# Patient Record
Sex: Female | Born: 1940 | Race: White | Hispanic: No
Health system: Southern US, Community
[De-identification: ages and names within clinical notes are randomized; demographics above are authoritative.]

## PROBLEM LIST (undated history)

## (undated) DIAGNOSIS — Z923 Personal history of irradiation: Secondary | ICD-10-CM

## (undated) DIAGNOSIS — C4490 Unspecified malignant neoplasm of skin, unspecified: Secondary | ICD-10-CM

## (undated) DIAGNOSIS — L570 Actinic keratosis: Secondary | ICD-10-CM

## (undated) DIAGNOSIS — L309 Dermatitis, unspecified: Secondary | ICD-10-CM

## (undated) DIAGNOSIS — K219 Gastro-esophageal reflux disease without esophagitis: Secondary | ICD-10-CM

## (undated) DIAGNOSIS — M545 Low back pain, unspecified: Secondary | ICD-10-CM

## (undated) DIAGNOSIS — Z9889 Other specified postprocedural states: Secondary | ICD-10-CM

## (undated) DIAGNOSIS — M1990 Unspecified osteoarthritis, unspecified site: Secondary | ICD-10-CM

## (undated) DIAGNOSIS — M75100 Unspecified rotator cuff tear or rupture of unspecified shoulder, not specified as traumatic: Secondary | ICD-10-CM

## (undated) HISTORY — PX: BREAST LUMPECTOMY: SHX2

## (undated) HISTORY — DX: Actinic keratosis: L57.0

## (undated) HISTORY — DX: Unspecified malignant neoplasm of skin, unspecified: C44.90

## (undated) HISTORY — PX: JOINT REPLACEMENT: SHX530

## (undated) HISTORY — PX: COLONOSCOPY, DIAGNOSTIC (SCREENING): SHX174

## (undated) HISTORY — PX: EGD: SHX3789

---

## 2002-07-21 HISTORY — PX: KNEE ARTHROSCOPY: SUR90

## 2012-07-21 DIAGNOSIS — C50919 Malignant neoplasm of unspecified site of unspecified female breast: Secondary | ICD-10-CM

## 2012-07-21 HISTORY — DX: Malignant neoplasm of unspecified site of unspecified female breast: C50.919

## 2013-04-04 ENCOUNTER — Other Ambulatory Visit: Payer: Self-pay | Admitting: Surgery

## 2013-04-04 NOTE — Pre-Procedure Instructions (Signed)
Low risk surgery, no medications,benign hx, no testing needed. H/P to scan

## 2013-04-05 ENCOUNTER — Other Ambulatory Visit: Payer: Self-pay

## 2013-04-06 ENCOUNTER — Encounter: Payer: Self-pay | Admitting: Physician Assistant

## 2013-04-06 ENCOUNTER — Ambulatory Visit
Admission: RE | Admit: 2013-04-06 | Discharge: 2013-04-06 | Disposition: A | Discharge status: Home / Self Care | Payer: Medicare Other | Source: Ambulatory Visit | Attending: Surgery | Admitting: Surgery

## 2013-04-06 ENCOUNTER — Other Ambulatory Visit: Payer: Self-pay

## 2013-04-06 ENCOUNTER — Ambulatory Visit: Payer: Medicare Other

## 2013-04-06 ENCOUNTER — Encounter
Admission: RE | Disposition: A | Discharge status: Home / Self Care | Payer: Self-pay | Source: Ambulatory Visit | Attending: Surgery

## 2013-04-06 ENCOUNTER — Ambulatory Visit: Payer: Medicare Other | Admitting: Physician Assistant

## 2013-04-06 ENCOUNTER — Ambulatory Visit: Payer: Self-pay

## 2013-04-06 ENCOUNTER — Ambulatory Visit: Payer: Medicare Other | Admitting: Surgery

## 2013-04-06 DIAGNOSIS — Z170 Estrogen receptor positive status [ER+]: Secondary | ICD-10-CM | POA: Insufficient documentation

## 2013-04-06 DIAGNOSIS — C50419 Malignant neoplasm of upper-outer quadrant of unspecified female breast: Secondary | ICD-10-CM | POA: Insufficient documentation

## 2013-04-06 DIAGNOSIS — Z803 Family history of malignant neoplasm of breast: Secondary | ICD-10-CM | POA: Insufficient documentation

## 2013-04-06 DIAGNOSIS — K219 Gastro-esophageal reflux disease without esophagitis: Secondary | ICD-10-CM | POA: Insufficient documentation

## 2013-04-06 HISTORY — PX: BIOPSY, SENTINEL NODE: SHX3265

## 2013-04-06 HISTORY — DX: Low back pain, unspecified: M54.50

## 2013-04-06 HISTORY — DX: Gastro-esophageal reflux disease without esophagitis: K21.9

## 2013-04-06 HISTORY — PX: BIOPSY, BREAST, TUMOR EXCISION, ULTRASOUND NEEDLE LOCALIZATION: SHX3236

## 2013-04-06 HISTORY — DX: Unspecified osteoarthritis, unspecified site: M19.90

## 2013-04-06 HISTORY — DX: Other specified postprocedural states: Z98.890

## 2013-04-06 HISTORY — DX: Dermatitis, unspecified: L30.9

## 2013-04-06 HISTORY — DX: Unspecified rotator cuff tear or rupture of unspecified shoulder, not specified as traumatic: M75.100

## 2013-04-06 SURGERY — BIOPSY, BREAST, WITH NEEDLE LOCALIZATION, WITH ULTRASOUND (US) GUIDANCE
Anesthesia: Anesthesia General | Site: Breast | Laterality: Right | Wound class: Clean

## 2013-04-06 MED ORDER — BUPIVACAINE-EPINEPHRINE (PF) 0.5% -1:200000 IJ SOLN
INTRAMUSCULAR | Status: AC
Start: 2013-04-06 — End: ?
  Filled 2013-04-06: qty 30

## 2013-04-06 MED ORDER — FENTANYL CITRATE 0.05 MG/ML IJ SOLN
INTRAMUSCULAR | Status: DC | PRN
Start: 2013-04-06 — End: 2013-04-06
  Administered 2013-04-06 (×4): 25 ug via INTRAVENOUS

## 2013-04-06 MED ORDER — ACETAMINOPHEN 325 MG PO TABS
650.0000 mg | ORAL_TABLET | Freq: Once | ORAL | Status: DC | PRN
Start: 2013-04-06 — End: 2013-04-06

## 2013-04-06 MED ORDER — LIDOCAINE HCL 1 % IJ SOLN
5.0000 mL | Freq: Once | INTRAMUSCULAR | Status: AC
Start: 2013-04-06 — End: 2013-04-06
  Administered 2013-04-06: 5 mL via INTRADERMAL

## 2013-04-06 MED ORDER — ONDANSETRON HCL 4 MG/2ML IJ SOLN
INTRAMUSCULAR | Status: AC
Start: 2013-04-06 — End: ?
  Filled 2013-04-06: qty 2

## 2013-04-06 MED ORDER — ONDANSETRON HCL 4 MG/2ML IJ SOLN
4.0000 mg | Freq: Once | INTRAMUSCULAR | Status: DC | PRN
Start: 2013-04-06 — End: 2013-04-06

## 2013-04-06 MED ORDER — BUPIVACAINE-EPINEPHRINE (PF) 0.5% -1:200000 IJ SOLN
INTRAMUSCULAR | Status: DC | PRN
Start: 2013-04-06 — End: 2013-04-06
  Administered 2013-04-06: 25 mL via INTRAMUSCULAR

## 2013-04-06 MED ORDER — LIDOCAINE-EPINEPHRINE 2 %-1:200000 IJ SOLN
INTRAMUSCULAR | Status: AC
Start: 2013-04-06 — End: ?
  Filled 2013-04-06: qty 20

## 2013-04-06 MED ORDER — TECHNETIUM TC 99M SULFUR COLLOID
307.0000 | Freq: Once | Status: AC | PRN
Start: 2013-04-06 — End: 2013-04-06
  Administered 2013-04-06: 307 via INTRADERMAL
  Filled 2013-04-06: qty 20

## 2013-04-06 MED ORDER — ONDANSETRON HCL 4 MG/2ML IJ SOLN
INTRAMUSCULAR | Status: DC | PRN
Start: 2013-04-06 — End: 2013-04-06
  Administered 2013-04-06: 4 mg via INTRAVENOUS

## 2013-04-06 MED ORDER — PROPOFOL 10 MG/ML IV EMUL
INTRAVENOUS | Status: AC
Start: 2013-04-06 — End: ?
  Filled 2013-04-06: qty 20

## 2013-04-06 MED ORDER — FENTANYL CITRATE 0.05 MG/ML IJ SOLN
INTRAMUSCULAR | Status: AC
Start: 2013-04-06 — End: ?
  Filled 2013-04-06: qty 2

## 2013-04-06 MED ORDER — MIDAZOLAM HCL 2 MG/2ML IJ SOLN
INTRAMUSCULAR | Status: DC | PRN
Start: 2013-04-06 — End: 2013-04-06
  Administered 2013-04-06 (×2): 1 mg via INTRAVENOUS

## 2013-04-06 MED ORDER — LACTATED RINGERS IV SOLN
INTRAVENOUS | Status: DC
Start: 2013-04-06 — End: 2013-04-06

## 2013-04-06 MED ORDER — ISOSULFAN BLUE 1 % SC SOLN
SUBCUTANEOUS | Status: AC
Start: 2013-04-06 — End: ?
  Filled 2013-04-06: qty 5

## 2013-04-06 MED ORDER — HYDROCODONE-ACETAMINOPHEN 5-325 MG PO TABS
1.0000 | ORAL_TABLET | ORAL | 0 refills | Status: DC | PRN
Start: 2013-04-06 — End: 2015-01-09
  Filled 2013-04-06: qty 30, 5d supply, fill #0

## 2013-04-06 MED ORDER — SODIUM CHLORIDE 0.9 % IR SOLN
Status: DC | PRN
Start: 2013-04-06 — End: 2013-04-06
  Administered 2013-04-06: 800 mL

## 2013-04-06 MED ORDER — FENTANYL CITRATE 0.05 MG/ML IJ SOLN
50.0000 ug | INTRAMUSCULAR | Status: DC | PRN
Start: 2013-04-06 — End: 2013-04-06

## 2013-04-06 MED ORDER — MIDAZOLAM HCL 2 MG/2ML IJ SOLN
INTRAMUSCULAR | Status: AC
Start: 2013-04-06 — End: ?
  Filled 2013-04-06: qty 2

## 2013-04-06 MED ORDER — PROMETHAZINE HCL 25 MG/ML IJ SOLN
6.2500 mg | Freq: Once | INTRAMUSCULAR | Status: DC | PRN
Start: 2013-04-06 — End: 2013-04-06

## 2013-04-06 MED ORDER — LIDOCAINE HCL 2 % IJ SOLN
INTRAMUSCULAR | Status: DC | PRN
Start: 2013-04-06 — End: 2013-04-06
  Administered 2013-04-06: 50 mg

## 2013-04-06 MED ORDER — LACTATED RINGERS IV SOLN
INTRAVENOUS | Status: DC | PRN
Start: 2013-04-06 — End: 2013-04-06

## 2013-04-06 MED ORDER — SODIUM CHLORIDE 0.9 % IV SOLN
INTRAVENOUS | Status: DC
Start: 2013-04-06 — End: 2013-04-06

## 2013-04-06 MED ORDER — MEPERIDINE HCL 25 MG/ML IJ SOLN
12.5000 mg | INTRAMUSCULAR | Status: DC | PRN
Start: 2013-04-06 — End: 2013-04-06

## 2013-04-06 MED ORDER — METHYLENE BLUE 1 % IJ SOLN
1.0000 mg | Freq: Once | INTRAMUSCULAR | Status: AC
Start: 2013-04-06 — End: 2013-04-06
  Administered 2013-04-06: 1 mg via INTRAVENOUS
  Filled 2013-04-06: qty 1

## 2013-04-06 MED ORDER — ISOSULFAN BLUE 1 % SC SOLN
SUBCUTANEOUS | Status: DC | PRN
Start: 2013-04-06 — End: 2013-04-06
  Administered 2013-04-06: 5 mL via INTRADERMAL

## 2013-04-06 MED ORDER — HYDROMORPHONE HCL PF 1 MG/ML IJ SOLN
0.5000 mg | INTRAMUSCULAR | Status: DC | PRN
Start: 2013-04-06 — End: 2013-04-06

## 2013-04-06 MED ORDER — LIDOCAINE-EPINEPHRINE 2 %-1:100000 IJ SOLN
INTRAMUSCULAR | Status: DC | PRN
Start: 2013-04-06 — End: 2013-04-06
  Administered 2013-04-06: 25 mL

## 2013-04-06 MED ORDER — DEXAMETHASONE SODIUM PHOSPHATE 4 MG/ML IJ SOLN
INTRAMUSCULAR | Status: AC
Start: 2013-04-06 — End: ?
  Filled 2013-04-06: qty 2

## 2013-04-06 MED ORDER — DEXAMETHASONE SODIUM PHOSPHATE 4 MG/ML IJ SOLN (WRAP)
INTRAMUSCULAR | Status: DC | PRN
Start: 2013-04-06 — End: 2013-04-06
  Administered 2013-04-06: 8 mg via INTRAVENOUS

## 2013-04-06 MED ORDER — PROPOFOL 10 MG/ML IV EMUL
INTRAVENOUS | Status: DC | PRN
Start: 2013-04-06 — End: 2013-04-06
  Administered 2013-04-06: 150 mg via INTRAVENOUS

## 2013-04-06 SURGICAL SUPPLY — 48 items
APPL BENZOIN/TINCTURE 1.1ML (Prep) ×3 IMPLANT
BANDAGE ADHESIVE L2 YD X W6 IN STRETCH (Bandage)
BANDAGE ADHESIVE L2 YD X W6 IN STRETCH NONWOVEN POROUS COVER-ROLL (Bandage) IMPLANT
BLADE S/SU RIBBACK CARB STL 15 (Blade) ×3 IMPLANT
BNDG CVRL ADH 2YDX6IN PLSTR POLYACRYLATE (Bandage)
CLIP INTERNAL L2.6 MM SMALL LIGATE OPEN (Clips)
CLIP INTERNAL L2.6 MM SMALL LIGATE OPEN LIGACLIP EXTRA TITANIUM BLUE (Clips) IMPLANT
CLIP INTERNAL L3.2 MM MEDIUM LIGATE OPEN (Clips)
CLIP INTERNAL L3.2 MM MEDIUM LIGATE OPEN LIGACLIPÂ® TITANIUM SILVER (Clips) IMPLANT
CLIP INTNL TI MED LGCLP EX 3.2MM LF STRL (Clips)
CLIP INTNL TI SM LGCLP EX 2.6MM LF STRL (Clips)
CLOTH BEACON TIMEOUT ORANGE (Other) ×3 IMPLANT
CNSR MEDIVAC CRD LINER W LID (Suction) ×3 IMPLANT
COVER PRB 72X5IN STRL ADH TLSCP FOLD (Sheath) ×1
COVER PROBE L72 IN X W5 IN ADHESIVE (Sheath) ×2
COVER PROBE L72 IN X W5 IN ADHESIVE TELESCOPIC FOLD (Sheath) ×2 IMPLANT
COVER PROBE SOFT FLEX L96 IN X W6 IN GEL (Procedure Accessories) ×2
COVER PROBE SOFT FLEX L96 IN X W6 IN GEL ULTRASOUND POLYURETHANE (Procedure Accessories) ×2 IMPLANT
DRAPE PROBE SOFT 6X96 (Procedure Accessories) ×1
DRESSING SCR TGDRM 4.5X3.5IN LF STRL FLM (Dressing) ×2
DRESSING SECUREMENT TEGADERM L4 1/2 IN X (Dressing) ×4
DRESSING SECUREMENT TEGADERM L4 1/2 IN X W3 1/2 IN INTRAVENOUS FILM (Dressing) ×4 IMPLANT
GLOVE SURG BIOGEL ORTHO SZ8 (Glove) ×3 IMPLANT
GLOVE SURG BIOGEL SZ6.5 (Glove) ×3 IMPLANT
GOWN SMART IMPERVIOUS LARGE (Gown) ×6 IMPLANT
KIT INFECTION CONTROL CUSTOM (Kits) ×3
KIT INFECTION CONTROL CUSTOM IFOH03 (Kits) ×2 IMPLANT
NEEDLE 25GA 1 1/2 (Needles) ×6 IMPLANT
NEEDLE REG BEVEL 20GX1.5IN (Needles) ×3 IMPLANT
STRIP SKIN CLOSURE L4 IN X W1/2 IN (Dressing) ×2
STRIP SKIN CLOSURE L4 IN X W1/2 IN REINFORCE STERI-STRIP POLYESTER (Dressing) ×2 IMPLANT
STRIP SKNCLS PLSTR STRSTRP 4X.5IN LF (Dressing) ×1
SUTURE ABS 2-0 SH VCL 27IN BRD COAT VIOL (Suture) ×1
SUTURE ABS 3-0 SH VCL 27IN BRD COAT VIOL (Suture) ×4
SUTURE COATED VICRYL 2-0 SH L27 IN BRAID (Suture) ×2
SUTURE COATED VICRYL 2-0 SH L27 IN BRAID COATED VIOLET ABSORBABLE (Suture) ×2 IMPLANT
SUTURE COATED VICRYL 3-0 SH L27 IN BRAID (Suture) ×8
SUTURE COATED VICRYL 3-0 SH L27 IN BRAID COATED VIOLET ABSORBABLE (Suture) ×8 IMPLANT
SUTURE MONOCRYL 5-0 P3 18IN (Suture) ×6 IMPLANT
SUTURE VICRYL 2-0 SH 27IN (Suture) IMPLANT
SYRINGE LUER LOCK SAFETY 10CC (Syringes, Needles) ×6 IMPLANT
TOWEL L26 IN X W17 IN COTTON PREWASH DELINT BLUE ACTISORB DELUXE (Procedure Accessories) ×2 IMPLANT
TOWEL SRG CTTN 26X17IN LF STRL PREWASH (Procedure Accessories) ×3
TRAY BASIN MINOR (Tray) IMPLANT
TRAY MAJOR (Pack) ×3 IMPLANT
TUBING CONNECTING STERILE 10FT (Tubing)
TUBING SUCTION ID3/16 IN L10 FT (Tubing)
TUBING SUCTION ID3/16 IN L10 FT NONCONDUCTIVE STRAIGHT MALE FEMALE (Tubing) IMPLANT

## 2013-04-06 NOTE — PACU (Signed)
Discharge instructions discussed and given to patient and patient's husband at bedside in pacu, prescriptions given, all questions answered, plan to d/c home.

## 2013-04-06 NOTE — Anesthesia Preprocedure Evaluation (Signed)
Anesthesia Evaluation    AIRWAY    Mallampati: III    TM distance: <3 FB  Neck ROM: full  Mouth Opening:limited   CARDIOVASCULAR    cardiovascular exam normal       DENTAL    No notable dental hx     PULMONARY    pulmonary exam normal     OTHER FINDINGS                      Anesthesia Plan    ASA 2     general                     intravenous induction               informed consent obtained    Plan discussed with CRNA.

## 2013-04-06 NOTE — PACU (Signed)
Re distributed cardiac leads and pt in nsr

## 2013-04-06 NOTE — PACU (Signed)
Post op ice bag applied to rt axilla. encour freq cdbe. Lungs clear bil ant

## 2013-04-06 NOTE — Transfer of Care (Signed)
Anesthesia Transfer of Care Note    Patient: Lauren [name]    Procedures performed: Procedure(s) with comments:  BIOPSY, BREAST, TUMOR EXCISION, ULTRASOUND NEEDLE LOCALIZATION - PARTIAL MASTECTOMY RIGHT BREAST PRE OPERATIVE PLACEMENT OF NEEDLE LOCALIZATION WIRE INTO BREAST & SENTINEL LYMPH NODE BIOPSY AXILLARY- MAPPING BY MD  Q1=UNK, ANES=GEN, ASST=WILL BRING PA, MD REQ=45MINS, EQUIP=N, ALLERGY=MORPHINE,BACTRIM,MOLD, POLLEN RAGWEED, VIOCODIN, BB=N 175, HT 5'3, USD NP AT 8AM, NUC MED INJ IN OR AT 9:15AM.   BIOPSY, SENTINEL NODE    Anesthesia type: General LMA    Patient location:Phase I PACU    Last vitals:   Filed Vitals:    04/06/13 0732   BP: 143/77   Pulse: 96   Temp: 96.8 F (36 C)   Resp: 18   SpO2: 96%       Post pain: Patient not complaining of pain, continue current therapy      Mental Status:awake    Respiratory Function: tolerating face mask    Cardiovascular: stable    Nausea/Vomiting: patient not complaining of nausea or vomiting    Hydration Status: adequate    Post assessment: no apparent anesthetic complications

## 2013-04-06 NOTE — Anesthesia Postprocedure Evaluation (Signed)
Anesthesia Post Evaluation    Patient: Lauren [name]    Procedures performed: Procedure(s) with comments:  BIOPSY, BREAST, TUMOR EXCISION, ULTRASOUND NEEDLE LOCALIZATION - PARTIAL MASTECTOMY RIGHT BREAST PRE OPERATIVE PLACEMENT OF NEEDLE LOCALIZATION WIRE INTO BREAST & SENTINEL LYMPH NODE BIOPSY AXILLARY- MAPPING BY MD  Q1=UNK, ANES=GEN, ASST=WILL BRING PA, MD REQ=45MINS, EQUIP=N, ALLERGY=MORPHINE,BACTRIM,MOLD, POLLEN RAGWEED, VIOCODIN, BB=N 175, HT 5'3, USD NP AT 8AM, NUC MED INJ IN OR AT 9:15AM.   BIOPSY, SENTINEL NODE    Anesthesia type: General LMA    Patient location:Phase I PACU    Last vitals:   Filed Vitals:    04/06/13 1110   BP: 108/63   Pulse: 90   Temp:    Resp: 16   SpO2: 95%       Post pain: Patient not complaining of pain, continue current therapy      Mental Status:awake    Respiratory Function: tolerating room air    Cardiovascular: stable    Nausea/Vomiting: patient not complaining of nausea or vomiting    Hydration Status: adequate    Post assessment: no apparent anesthetic complications

## 2013-04-06 NOTE — Op Note (Signed)
FULL OPERATIVE NOTE    Date Time: 04/06/2013 10:19 AM  Patient Name: Lauren [name]  Attending Physician: Rick Duff,*      Date of Operation:   04/06/2013    Providers Performing:   Surgeon(s):  Rick Duff, MD  Rainier, Josilu, PA    McDouall, Ronnette Hila, RN - Circulator  Defiance, Erin E - Scrub Person  Nicki Reaper, RN - Relief Circulator    Operative Procedure:   Procedure(s):  BIOPSY, BREAST, TUMOR EXCISION, ULTRASOUND NEEDLE LOCALIZATION  BIOPSY, SENTINEL NODE    Preoperative Diagnosis:   Pre-Op Diagnosis Codes:     * Malignant neoplasm of upper-outer quadrant of female breast, right [174.4]    Postoperative Diagnosis:   Malignant neoplasm of upper-outer quadrant of female breast, right [1610960] [    Indications:   same    Operative Notes:   After consent obtained, the patient was brought to the OR.  SCDs were placed and the patient was anesthetized.  Surgical pause was performed. The patient was prepped and draped in sterile fashion.  Nuclear medicine injection was done at the R areola.  Lymphazurin blue was also injected per protocol at the tumor and the areola.    The gamma probe was utilized to identify a hot spot in the R axilla.  A 3 cm incision was used to acces this area.  Cautery was used to open the deep axillary compartment.  A hot, blue  Node was excised, with counts greater than 10x the background counts.  The incision was closed in 2 layers with 3-0 vicryl and monocryl. The R breast was approached with a crescentric incision in the UOQ over the palpable mass representing the biopsy hematoma.  Flaps were dissected out, and the wire was delivered into the incision.  A generous lumpectomy performed with wide palpable margins, including the entire wire tract.  Margins were marked with the usual sutures.  The faxitron was used to confirm the biopsy clip was in the specimen.  The wound was inspected for hemostasis and closed in 2 layers.   The patient was awakened and sent to PACU  in stable condition with all counts correct.      Estimated Blood Loss:   * No values recorded between 04/06/2013  9:45 AM and 04/06/2013 10:19 AM *    Implants:   * No implants in log *    Drains:   Drains: no    Specimens:       Complications:   none      Signed by: Rick Duff, MD

## 2013-04-06 NOTE — Interval H&P Note (Signed)
The patient was reinterviewed and reexamined.  No change in H&P since last documented.

## 2013-04-06 NOTE — Discharge Instructions (Signed)
After your Breast Surgery    Activity:  After your discharge, you may be up and around as much as you desire.  You may walk and go up and down stairs.  Your level of discomfort will guide the amount of activity you can do.  You should avoid strenuous activity and heavy lifting greater than 10 lbs until you have been seen in the office for your post operative visit.  Once you have stopped taking prescription medication and can walk without difficulty, you may drive again.    Care for the incision: Bruising around the incision is normal and not a cause for concern.  Apply ice to the site to lessen any discomfort and bruising that may occur. Do this intermittently for 20 minutes at a time as needed.  Wear a supportive bra to keep the breast from moving during the first few days which will help to minimize the discomfort.  If you notice any bright red bleeding, apply pressure to the area and call our office immediately.      If, upon discharge, your wound is well-sealed and without drainage, you may shower 48 hours after your surgery.  Leave the white steri-strips on the incision for at least one-week.  The steri-strips may get wet and generally fall off on their own.  No soaking of your incision for two weeks, including baths, pools, the ocean or hot tubs.  Gently pat the incision dry after showering. The wound does not need gauze dressing, unless you wish to apply one.    If there is some draining, or if the wound has been left open, you will need a gauze dressing over it as it heals.      If you have sutures, do not disturb them; we will remove them at the time of your post op appointment.    If you had a Sentinel Node procedure, your urine will be bluish in color after surgery.  Your skin may also have a faint bluish color.  This will go away as the dye clears from your body.  Staying hydrated is important to help flush your system.    Diet:  Initially, for the first 24 hours, you may not feel very hungry or  feel able to tolerate heavy food; this is normal.  We encourage you to keep up with liquids. There are no dietary restrictions.  Eat what your system can tolerate.       Medications:  You may resume home medications as per your medication reconciliation form.  You will be given a prescription for pain medication. Take this as directed for post-operative pain.  If you are experiencing only mild discomfort, you may find over-the-counter medications, such as Tylenol (acetaminophen) or Advil/Nuprin (Ibuprofen), may be all you need for comfort.  Take stool softeners such as colace, metamucil, or sennokot while taking pain medicine to avoid constipation.    What to look for:  Frequent bowel movements or diarrhea is common.  Generally this will improve over 2 to 3 weeks after surgery.  Call our office if you do not have a bowel movement in 48 hours despite use of a stool softener or if there is blood in your stool.  You may notice a slight drainage or redness around sutures or clips at the incision.  This is normal and not a cause for concern.  However, please call our office immediately or go to the ER if you develop any of the following:      --   drainage, increased pain, warmth, swelling or redness at or around the incision  -- bleeding that soaks thru the bandage  -- fever over 101.71F  -- persistent nausea or vomiting  -- difficulty breathing, shortness of breath, chest pain or calf pain  -- urinary retention  -- constipation which does not improve over 48 hours.    Follow-up:  You will be seen in our office 7 to 10 days after your surgery.  Prior to surgery, you should have made an appointment for your  post-operative visit.  If for some reason that appointment was not scheduled, please call our office at [phone] as soon as your return home to schedule your appointment.  A pathologist will examine your specimen and the final report will be available at your postoperative appointment.    Difficulties:  Please  call us if any problems or questions arise.  We can be reached any time, including evenings and weekends, by calling our office number [phone].

## 2013-04-07 ENCOUNTER — Encounter: Payer: Self-pay | Admitting: Surgery

## 2014-10-16 ENCOUNTER — Telehealth (HOSPITAL_BASED_OUTPATIENT_CLINIC_OR_DEPARTMENT_OTHER): Payer: Self-pay

## 2014-10-16 DIAGNOSIS — Z13820 Encounter for screening for osteoporosis: Secondary | ICD-10-CM

## 2014-10-16 DIAGNOSIS — Z79899 Other long term (current) drug therapy: Secondary | ICD-10-CM

## 2014-10-16 DIAGNOSIS — M1990 Unspecified osteoarthritis, unspecified site: Secondary | ICD-10-CM

## 2014-10-16 DIAGNOSIS — N959 Unspecified menopausal and perimenopausal disorder: Secondary | ICD-10-CM

## 2014-10-16 DIAGNOSIS — C50919 Malignant neoplasm of unspecified site of unspecified female breast: Secondary | ICD-10-CM

## 2014-10-16 DIAGNOSIS — Z79811 Long term (current) use of aromatase inhibitors: Secondary | ICD-10-CM

## 2014-10-16 MED ORDER — ANASTROZOLE 1 MG PO TABS
1.0000 mg | ORAL_TABLET | Freq: Every day | ORAL | Status: DC
Start: 2014-10-16 — End: 2015-01-09

## 2014-10-16 MED ORDER — ANASTROZOLE 1 MG PO TABS
1.0000 mg | ORAL_TABLET | Freq: Every day | ORAL | Status: DC
Start: 2014-10-16 — End: 2014-10-16

## 2014-10-16 NOTE — Telephone Encounter (Signed)
Patient left message- states that it will take 2 weeks for [facility] pharmacy to deliver her Arimidex. Patient requesting for her revlimid to be refilled for a month to be sent to [facility] [place] [facility], to get her covered until the delivery from [facility] arrives. Patient wants a call back regarding this refill [phone]

## 2014-10-16 NOTE — Telephone Encounter (Signed)
Spoke with patient she needs local fill at [facility] and long term rx sent to [facility] Patient due for follow up June time frame but does not want to schedule now due to vacation planning. She will have her Dexa Scan in April and misplaced her order. Advised will mail her order to her home. Prescriptions sent to pharmacies.

## 2014-10-18 ENCOUNTER — Other Ambulatory Visit (HOSPITAL_BASED_OUTPATIENT_CLINIC_OR_DEPARTMENT_OTHER): Payer: Self-pay

## 2014-10-18 DIAGNOSIS — M8580 Other specified disorders of bone density and structure, unspecified site: Secondary | ICD-10-CM

## 2014-10-18 DIAGNOSIS — C50919 Malignant neoplasm of unspecified site of unspecified female breast: Secondary | ICD-10-CM

## 2014-10-18 DIAGNOSIS — N959 Unspecified menopausal and perimenopausal disorder: Secondary | ICD-10-CM

## 2014-10-18 DIAGNOSIS — Z79811 Long term (current) use of aromatase inhibitors: Secondary | ICD-10-CM

## 2014-10-18 NOTE — Telephone Encounter (Signed)
Order for Dexa to be entered and mailed to patient.

## 2014-11-16 ENCOUNTER — Encounter (INDEPENDENT_AMBULATORY_CARE_PROVIDER_SITE_OTHER): Payer: Self-pay | Admitting: Hematology & Oncology

## 2014-11-17 ENCOUNTER — Encounter (HOSPITAL_BASED_OUTPATIENT_CLINIC_OR_DEPARTMENT_OTHER): Payer: Self-pay | Admitting: Hematology & Oncology

## 2015-01-09 ENCOUNTER — Encounter (INDEPENDENT_AMBULATORY_CARE_PROVIDER_SITE_OTHER): Payer: Self-pay | Admitting: Hematology & Oncology

## 2015-01-09 ENCOUNTER — Encounter (HOSPITAL_BASED_OUTPATIENT_CLINIC_OR_DEPARTMENT_OTHER): Payer: Self-pay | Admitting: Hematology & Oncology

## 2015-01-09 ENCOUNTER — Ambulatory Visit (INDEPENDENT_AMBULATORY_CARE_PROVIDER_SITE_OTHER): Payer: Medicare Other | Admitting: Hematology & Oncology

## 2015-01-09 VITALS — BP 140/75 | HR 82 | Temp 98.2°F | Resp 16 | Wt 176.2 lb

## 2015-01-09 DIAGNOSIS — C50811 Malignant neoplasm of overlapping sites of right female breast: Secondary | ICD-10-CM

## 2015-01-09 MED ORDER — ANASTROZOLE 1 MG PO TABS
1.0000 mg | ORAL_TABLET | Freq: Every day | ORAL | Status: DC
Start: 2015-01-09 — End: 2016-01-23

## 2015-01-14 ENCOUNTER — Encounter (INDEPENDENT_AMBULATORY_CARE_PROVIDER_SITE_OTHER): Payer: Self-pay | Admitting: Hematology & Oncology

## 2015-01-14 DIAGNOSIS — C50811 Malignant neoplasm of overlapping sites of right female breast: Secondary | ICD-10-CM | POA: Insufficient documentation

## 2015-01-14 NOTE — Progress Notes (Signed)
PROGRESS NOTE      Patient Name: [name],[name]      CC/HPI:     Chief Complaint   Patient presents with   . Follow-up     Lauren [name] comes in for management of breast cancer. She is s/p mammogram and DEXA scan, and will review the results today. She reports feeling well and has no concerns complaints.       Follow up for stage I breast cancer (pT1a pN0) s/p lumpectomy/radiation.  On Arimidex now for ~ 1 1/2 years.  Has some joint pain, not worse compared to before Arimidex.  Has lost weight intentionally, reports lipid panel improved as a result.  No dyspnea or other concerns.  S/P recent dexa scan - stable osteopenia of femoral neck only.  S/P recent right mammogram - benign.    ROS:     Review of Systems   Constitutional: Negative for malaise/fatigue.   Respiratory: Negative for shortness of breath.    Musculoskeletal: Positive for joint pain.       Medications/Allergies:     Outpatient Prescriptions Marked as Taking for the 01/09/15 encounter (Office Visit) with Alphonsa Overall, MD   Medication Sig Dispense Refill   . anastrozole (ARIMIDEX) 1 MG tablet Take 1 tablet (1 mg total) by mouth daily. 90 tablet 3   . atorvastatin (LIPITOR) 10 MG tablet Take 10 mg by mouth every evening.     . cetirizine (ZYRTEC) 10 MG tablet Take 10 mg by mouth daily.     . Cholecalciferol (VITAMIN D) 1000 UNIT tablet Take 1,000 Units by mouth daily.     . Meclizine HCl (ANTIVERT) 25 MG Chew Tab Chew 12.5 mg by mouth as needed.     . montelukast (SINGULAIR) 10 MG tablet Take 10 mg by mouth daily.     . pantoprazole (PROTONIX) 40 MG tablet Take 40 mg by mouth daily.     . [DISCONTINUED] anastrozole (ARIMIDEX) 1 MG tablet Take 1 tablet (1 mg total) by mouth daily. 60 tablet 0       Allergies   Allergen Reactions   . Bactrim [Sulfamethoxazole W/Trimethoprim (Co-Trimoxazole)]      GI bleed   . Cholestatin Other (See Comments)     Hay fever     . Oxycodone Other (See Comments)     vertigo   . Percocet [Oxycodone-Acetaminophen] Other (See  Comments)     vertigo       Past History:     The following portions of the patient's history were reviewed and updated as appropriate: allergies, current medications, past family history, past medical history, past social history, past surgical history and problem list.     Past Medical History   Diagnosis Date   . Post-operative nausea and vomiting    . Gastroesophageal reflux disease      controlled w meds   . Low back pain    . Arthritis    . Eczema    . Rotator cuff tear right        History reviewed. No pertinent family history.    History     Social History   . Marital Status: Married     Spouse Name: N/A   . Number of Children: N/A   . Years of Education: N/A     Occupational History   . Not on file.     Social History Main Topics   . Smoking status: Never Smoker    . Smokeless tobacco: Never Used   .  Alcohol Use: Yes      Comment: occasional   . Drug Use: No   . Sexual Activity: Not on file     Other Topics Concern   . Not on file     Social History Narrative       Patient Active Problem List   Diagnosis   . Malignant neoplasm of overlapping sites of right female breast       Physical Exam:     Filed Vitals:    01/09/15 1332   BP: 140/75   Pulse: 82   Temp: 98.2 F (36.8 C)   Resp: 16        Physical Exam   Constitutional: She appears well-nourished.   Eyes: Conjunctivae are normal. No scleral icterus.   Cardiovascular: Normal heart sounds.  Exam reveals no gallop and no friction rub.    No murmur heard.  Pulmonary/Chest: Effort normal and breath sounds normal. She has no wheezes. Right breast exhibits no mass (s/p lumpectomy), no skin change and no tenderness. Left breast exhibits no mass, no skin change and no tenderness.   Abdominal: Soft. She exhibits no distension and no mass. There is no hepatosplenomegaly. There is no tenderness. No hernia.   Musculoskeletal: She exhibits no edema.   Lymphadenopathy:     She has no cervical adenopathy.     She has no axillary adenopathy.        Right: No  supraclavicular adenopathy present.        Left: No supraclavicular adenopathy present.   Vitals reviewed.      Assessment/Plan:     Diagnosis:            Orders:  1. 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  Will continue Arimidex.  I reviewed the dexa scan results with her.    Follow-up in 6 months.

## 2015-05-30 ENCOUNTER — Encounter (HOSPITAL_BASED_OUTPATIENT_CLINIC_OR_DEPARTMENT_OTHER): Payer: Self-pay | Admitting: Hematology & Oncology

## 2015-08-09 ENCOUNTER — Encounter (INDEPENDENT_AMBULATORY_CARE_PROVIDER_SITE_OTHER): Payer: Self-pay | Admitting: Hematology & Oncology

## 2015-08-09 ENCOUNTER — Ambulatory Visit (INDEPENDENT_AMBULATORY_CARE_PROVIDER_SITE_OTHER): Payer: Medicare Other | Admitting: Hematology & Oncology

## 2015-08-09 VITALS — BP 131/73 | HR 94 | Temp 97.4°F | Resp 16 | Wt 177.0 lb

## 2015-08-09 DIAGNOSIS — C50811 Malignant neoplasm of overlapping sites of right female breast: Secondary | ICD-10-CM

## 2015-08-11 NOTE — Progress Notes (Signed)
HEMATOLOGY/ONCOLOGY PROGRESS NOTE      Patient Name: Lauren [name],Lauren [name]      CC/HPI:     Chief Complaint   Patient presents with   . Follow-up     Lauren [name] comes in for management of breast cancer. She continues on arimidex. She reports feeling well and has no concerns or complaints.        Follow up for stage I (pT1a pN0) ER(+) breast cancer s/p lumpectomy/radiation.  Continues on Arimidex.  She is doing well overall.  She does have hot flashes, but these are tolerable.  No joint pain, dyspnea or other concerns.  Mammogram in October was benign.    Medications/Allergies:     Outpatient Prescriptions Marked as Taking for the 08/09/15 encounter (Office Visit) with Alphonsa Overall, MD   Medication Sig Dispense Refill   . anastrozole (ARIMIDEX) 1 MG tablet Take 1 tablet (1 mg total) by mouth daily. 90 tablet 3   . atorvastatin (LIPITOR) 10 MG tablet Take 10 mg by mouth every evening.     . Cholecalciferol (VITAMIN D) 1000 UNIT tablet Take 1,000 Units by mouth daily.     . Meclizine HCl (ANTIVERT) 25 MG Chew Tab Chew 12.5 mg by mouth as needed.     . pantoprazole (PROTONIX) 40 MG tablet Take 40 mg by mouth daily.         Allergies   Allergen Reactions   . Bactrim [Sulfamethoxazole W/Trimethoprim (Co-Trimoxazole)]      GI bleed   . Cholestatin Other (See Comments)     Hay fever     . Oxycodone Other (See Comments)     vertigo   . Percocet [Oxycodone-Acetaminophen] Other (See Comments)     vertigo       Past History:     Past Medical History   Diagnosis Date   . Post-operative nausea and vomiting    . Gastroesophageal reflux disease      controlled w meds   . Low back pain    . Arthritis    . Eczema    . Rotator cuff tear right        No family history on file.    Social History     Social History   . Marital Status: Married     Spouse Name: N/A   . Number of Children: N/A   . Years of Education: N/A     Occupational History   . Not on file.     Social History Main Topics   . Smoking status: Never Smoker    . Smokeless  tobacco: Never Used   . Alcohol Use: Yes      Comment: occasional   . Drug Use: No   . Sexual Activity: Not on file     Other Topics Concern   . Not on file     Social History Narrative       Patient Active Problem List   Diagnosis   . 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   Eyes: Conjunctivae are normal. No scleral icterus.   Cardiovascular: Normal heart sounds.  Exam reveals no gallop and no friction rub.  No murmur heard.  Pulmonary/Chest: Effort normal and breath sounds normal. She has no wheezes. Right breast exhibits no mass (s/p lumpectomy), no skin change and no tenderness. Left breast exhibits no mass, no skin change and no tenderness.   Abdominal: Soft. She exhibits no distension and no mass. There is no hepatosplenomegaly. There is no tenderness. No hernia.   Musculoskeletal: She exhibits no edema.   Lymphadenopathy:     She has no cervical adenopathy.     She has no axillary adenopathy.        Right: No supraclavicular adenopathy present.        Left: No supraclavicular adenopathy present.   Vitals reviewed.      Assessment/Plan:     Diagnosis:            Orders:  1. 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  She will continue Arimidex.  Follow up in 6 months.

## 2016-01-23 ENCOUNTER — Telehealth (INDEPENDENT_AMBULATORY_CARE_PROVIDER_SITE_OTHER): Payer: Self-pay

## 2016-01-23 DIAGNOSIS — C50811 Malignant neoplasm of overlapping sites of right female breast: Secondary | ICD-10-CM

## 2016-01-23 MED ORDER — ANASTROZOLE 1 MG PO TABS
1.0000 mg | ORAL_TABLET | Freq: Every day | ORAL | Status: DC
Start: 2016-01-23 — End: 2016-04-03

## 2016-01-23 NOTE — Telephone Encounter (Signed)
Rx sent to [facility] for 90 day supply.   Called pt-left message that rx was sent and to confirm f/u appt in September.

## 2016-01-23 NOTE — Telephone Encounter (Signed)
Ms. Edelen requests a script refill on Arimidex to be sent to her new home address [address] [place], [place] [address] via her [facility] mail delivery. She also requested a called back on [phone] after the script has been sent.

## 2016-04-03 ENCOUNTER — Ambulatory Visit (INDEPENDENT_AMBULATORY_CARE_PROVIDER_SITE_OTHER): Payer: Medicare Other | Admitting: Hematology & Oncology

## 2016-04-03 ENCOUNTER — Ambulatory Visit (INDEPENDENT_AMBULATORY_CARE_PROVIDER_SITE_OTHER): Payer: Medicare Other | Admitting: Medical

## 2016-04-03 VITALS — BP 132/70 | HR 97 | Temp 97.7°F | Resp 18 | Ht 61.0 in | Wt 178.0 lb

## 2016-04-03 DIAGNOSIS — C50811 Malignant neoplasm of overlapping sites of right female breast: Secondary | ICD-10-CM

## 2016-04-03 DIAGNOSIS — C50919 Malignant neoplasm of unspecified site of unspecified female breast: Secondary | ICD-10-CM | POA: Insufficient documentation

## 2016-04-03 MED ORDER — ANASTROZOLE 1 MG PO TABS
1.0000 mg | ORAL_TABLET | Freq: Every day | ORAL | 3 refills | Status: AC
Start: 2016-04-03 — End: ?

## 2016-04-03 NOTE — Progress Notes (Signed)
PROGRESS NOTE      Patient Name: Lauren [name],Lauren [name]    Patient profile:       Patient Active Problem List    Diagnosis Date Noted   . 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 Breast Cancer     Ms. Lauren [name] presents for follow up. She continues on arimidex, noting severe hot flashes, but stating that she cannot tell if her joint pain is due to age or AI tx. She also reports a new "enlargement" under her right arm. She also reports swelling in her hands and wrists. Her last mammogram was on 05/09/15 and her last DXA on 11/08/14 showed a lowest t-score of -1.6 of left femoral neck.      Ms. Lauren [name] presents today for follow up of her breast cancer. She continues on AI therapy with Arimidex. She is due for a mammogram next month. She continues to live in [place] with her husband however they still travel to the area to see their physicians several times a year. She established care with a PCP in [facility]. She is due for an EGD early 2018.     Medications:     Current Outpatient Prescriptions:   .  anastrozole (ARIMIDEX) 1 MG tablet, Take 1 tablet (1 mg total) by mouth daily., Disp: 90 tablet, Rfl: 0  .  atorvastatin (LIPITOR) 10 MG tablet, Take 10 mg by mouth every evening., Disp: , Rfl:   .  Cholecalciferol (VITAMIN D) 1000 UNIT tablet, Take 1,000 Units by mouth daily., Disp: , Rfl:   .  fluticasone (FLONASE) 50 MCG/ACT nasal spray, 1 spray by Each Nare route daily., Disp: , Rfl:   .  montelukast (SINGULAIR) 10 MG tablet, Take 10 mg by mouth daily., Disp: , Rfl:   .  pantoprazole (PROTONIX) 40 MG tablet, Take 40 mg by mouth daily., Disp: , Rfl:   .  cetirizine (ZYRTEC) 10 MG tablet, Take 10 mg by mouth daily., Disp: , Rfl:   .  cycloSPORINE (RESTASIS) 0.05 % ophthalmic emulsion, Place 1 drop into both eyes 2 (two) times daily., Disp: , Rfl:   .  Meclizine HCl (ANTIVERT) 25 MG Chew Tab, Chew 12.5 mg by mouth as  needed., Disp: , Rfl:   .  promethazine (PHENERGAN) 25 MG tablet, , Disp: , Rfl:   .  triamcinolone (KENALOG) 0.1 % cream, , Disp: , Rfl:     Past History:   The following portions of the patient's history were reviewed and updated as appropriate: allergies, current medications, past family history, past medical history, past social history, past surgical history and problem list.      Physical Exam:     Vitals:    04/03/16 1042   BP: 132/70   Pulse: 97   Resp: 18   Temp: 97.7 F (36.5 [name])      GENERAL: Well developed, well nourished woman, in no distress  SKIN:  No pallor.  HEENT: Normocephalic, anicteric, PERRL, benign oropharynx  NECK:  No thyromegaly, no masses  LUNGS: Clear to auscultation and percussion  CARDIAC: Regular rate and rhythm, no murmurs, rubs, or gallops  BREASTS: R breast s/p lumpectomy with no palpable masses or abnormalities.   ABDOMEN: Soft, nontender, no hepatosplenomegaly, no masses.  EXT:  No cyanosis, clubbing, or edema  MUSCULO: No arthritic changes  in hands or knees  LYMPH: No cervical, supraclavicular, or axillary adenopathy      Labs:   CBC  No results found for: WBC, HGB, HCT, PLT, MCV, RDW        Rads:   No results found.    Assessment:   Diagnosis:            Orders:  1. Malignant neoplasm of female breast, unspecified laterality, unspecified site of breast  Mammography diagnostic bilateral   2. Malignant neoplasm of overlapping sites of right female breast  anastrozole (ARIMIDEX) 1 MG tablet         Discussion/Plan:     Ms. Lauren [name] is doing well overall and has no evidence of active disease at this time. She will continue adjuvant therapy with Arimidex. Mammogram order and med refill provided to patient. She will return in six months for follow up, or sooner if any issues arise.

## 2016-04-14 ENCOUNTER — Ambulatory Visit (HOSPITAL_BASED_OUTPATIENT_CLINIC_OR_DEPARTMENT_OTHER): Payer: Medicare Other | Admitting: Hematology & Oncology

## 2016-05-01 ENCOUNTER — Other Ambulatory Visit (INDEPENDENT_AMBULATORY_CARE_PROVIDER_SITE_OTHER): Payer: Self-pay

## 2016-05-01 ENCOUNTER — Telehealth (INDEPENDENT_AMBULATORY_CARE_PROVIDER_SITE_OTHER): Payer: Self-pay | Admitting: Hematology & Oncology

## 2016-05-01 DIAGNOSIS — Z170 Estrogen receptor positive status [ER+]: Secondary | ICD-10-CM

## 2016-05-01 NOTE — Telephone Encounter (Signed)
Gayle from radiology facility in [place] called reg pt's [place] Bilat order. Please add Diagnostic Mammogram and fax updated order to [id number]. Any questions, call [phone].    Thank you!

## 2016-06-06 ENCOUNTER — Encounter (INDEPENDENT_AMBULATORY_CARE_PROVIDER_SITE_OTHER): Payer: Self-pay | Admitting: Hematology & Oncology

## 2016-12-23 ENCOUNTER — Telehealth (INDEPENDENT_AMBULATORY_CARE_PROVIDER_SITE_OTHER): Payer: Self-pay

## 2016-12-23 NOTE — Telephone Encounter (Signed)
Patient moved to [facility] and found an Oncologist to follow up. She would like to thank Dr. Athena Masse for being her oncologist. No need to call back.

## 2016-12-23 NOTE — Telephone Encounter (Signed)
Will provide message to Dr Athena Masse

## 2020-09-14 ENCOUNTER — Other Ambulatory Visit: Payer: Self-pay | Admitting: Internal Medicine

## 2020-09-14 DIAGNOSIS — N6489 Other specified disorders of breast: Secondary | ICD-10-CM

## 2020-10-01 ENCOUNTER — Ambulatory Visit
Admission: RE | Admit: 2020-10-01 | Discharge: 2020-10-01 | Disposition: A | Discharge status: Home / Self Care | Payer: Medicare Other | Source: Ambulatory Visit | Attending: Internal Medicine | Admitting: Internal Medicine

## 2020-10-01 ENCOUNTER — Other Ambulatory Visit: Payer: Self-pay

## 2020-10-01 DIAGNOSIS — N6489 Other specified disorders of breast: Secondary | ICD-10-CM | POA: Diagnosis present

## 2020-10-01 HISTORY — DX: Personal history of irradiation: Z92.3

## 2021-04-29 ENCOUNTER — Encounter: Payer: Self-pay | Admitting: Dermatology

## 2021-04-29 ENCOUNTER — Other Ambulatory Visit: Payer: Self-pay

## 2021-04-29 ENCOUNTER — Ambulatory Visit (INDEPENDENT_AMBULATORY_CARE_PROVIDER_SITE_OTHER): Payer: Medicare Other | Admitting: Dermatology

## 2021-04-29 DIAGNOSIS — D1800 Hemangioma unspecified site: Secondary | ICD-10-CM

## 2021-04-29 DIAGNOSIS — Z85828 Personal history of other malignant neoplasm of skin: Secondary | ICD-10-CM | POA: Diagnosis not present

## 2021-04-29 DIAGNOSIS — L309 Dermatitis, unspecified: Secondary | ICD-10-CM

## 2021-04-29 DIAGNOSIS — L570 Actinic keratosis: Secondary | ICD-10-CM

## 2021-04-29 DIAGNOSIS — Z1283 Encounter for screening for malignant neoplasm of skin: Secondary | ICD-10-CM | POA: Diagnosis not present

## 2021-04-29 DIAGNOSIS — L820 Inflamed seborrheic keratosis: Secondary | ICD-10-CM | POA: Diagnosis not present

## 2021-04-29 DIAGNOSIS — B009 Herpesviral infection, unspecified: Secondary | ICD-10-CM | POA: Diagnosis not present

## 2021-04-29 DIAGNOSIS — L821 Other seborrheic keratosis: Secondary | ICD-10-CM

## 2021-04-29 DIAGNOSIS — L853 Xerosis cutis: Secondary | ICD-10-CM

## 2021-04-29 DIAGNOSIS — L578 Other skin changes due to chronic exposure to nonionizing radiation: Secondary | ICD-10-CM

## 2021-04-29 DIAGNOSIS — L280 Lichen simplex chronicus: Secondary | ICD-10-CM

## 2021-04-29 MED ORDER — PIMECROLIMUS 1 % EX CREA: TOPICAL_CREAM | CUTANEOUS | 5 refills | Status: DC

## 2021-04-29 NOTE — Progress Notes (Signed)
New Patient Visit  Subjective  Kerry George is a 80 y.o. female who presents for the following: TBSE (New patient presents to establish care. She moved to [facility] in December 2021 and lives at [facility]. She saw a dermatologist in [place] every 6 months, will request records. 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 There was a spot on her left nose that patient used 5FU Cream on and it went away. Patient would like area rechecked. She has a rough spot on her right lateral brow area and itching of the upper forehead.). She has a long history of eczema on the back/body, improved with clobetasol cream. She has seasonal allergies. She has a fever blister on her upper lip that came up recently. She is using Abreeva, which helps. She normally only gets breakouts in early fall. 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    The following portions of the chart were reviewed this encounter and updated as appropriate:       Review of Systems:  No other skin or systemic complaints except as noted in HPI or Assessment and Plan.  Objective  Well appearing patient in no apparent distress; mood and affect are within normal limits.  A full examination was performed including scalp, head, eyes, ears, nose, lips, neck, chest, axillae, abdomen, back, buttocks, bilateral upper extremities, bilateral lower extremities, hands, feet, fingers, toes, fingernails, and toenails. All findings within normal limits unless otherwise noted below.  upper lip below nose Crusted pink papule.  back, forehead, abdomen Lichenified patch on left spinal mid back, pink patch with purpura and excoriations of the right lower abdomen. Light pink scaly patch mid forehead.  Pt also reports itching in body fold areas and face and has been applying clobetasol there as needed  left upper arm x 1 Waxy pink white slightly depressed macule.  L upper forehead x 1,  L lateral eyebrow x 1 (2) Pink scaly macules.   Assessment & Plan  Herpes simplex upper lip below nose  Herpes Simplex Virus = Cold Sores = Fever Blisters is a chronic recurring blistering; scabbing sore-producing viral infection that is recurrent usually in the same area triggered by stress, sun/UV exposure and trauma.  It is infectious and can be spread from person to person by direct contact.  It is not curable, but is treatable with topical and oral medication.  Continue Abreeva Cream until improved.  Patient defers oral treatment.   Dermatitis back, forehead, abdomen  With LSC - back  Continue Clobetasol cream Apply to AA back BID until improved, avoid face, groin, axilla. Patient has at home, will call for refills. Caution atrophy with long-term use.   Start Elidel Cream Apply to rash on face, body folds, abdomen BID until improved dsp 60g 5Rf.   Discussed Dupixent injections. Patient not interested at this time. May consider if worsens.   Atopic dermatitis (eczema) is a chronic, relapsing, pruritic condition that can significantly affect quality of life. It is often associated with allergic rhinitis and/or asthma and can require treatment with topical medications, phototherapy, or in severe cases a biologic medication called Dupixent in children and adults.  Recommend mild soap and moisturizing cream 1-2 times daily.  Gentle skin care handout provided.   Topical steroids (such as triamcinolone, fluocinolone, fluocinonide, mometasone, clobetasol, halobetasol, betamethasone, hydrocortisone) can cause thinning and lightening of the skin if they are used for  too long in the same area. Your physician has selected the right strength medicine for your problem and area affected on the body. Please use your medication only as directed by your physician to prevent side effects.     pimecrolimus (ELIDEL) 1 % cream - back, forehead, abdomen Apply to rash on face and body folds twice a day  until improved.  Inflamed seborrheic keratosis left upper arm x 1  vs irritated Scar  Recheck on follow-up.  Destruction of lesion - left upper arm x 1  Destruction method: cryotherapy   Informed consent: discussed and consent obtained   Lesion destroyed using liquid nitrogen: Yes   Region frozen until ice ball extended beyond lesion: Yes   Outcome: patient tolerated procedure well with no complications   Post-procedure details: wound care instructions given   Additional details:  Prior to procedure, discussed risks of blister formation, small wound, skin dyspigmentation, or rare scar following cryotherapy. Recommend Vaseline ointment to treated areas while healing.   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 They are chronic with expected duration over 1 year. A portion of actinic keratoses will progress to squamous cell carcinoma of the skin. It is not possible to reliably predict which spots will progress to skin cancer and so treatment is recommended to prevent development of skin cancer.  Recommend daily broad spectrum sunscreen SPF 30+ to sun-exposed areas, reapply every 2 hours as needed.  Recommend staying in the shade or wearing long sleeves, sun glasses (UVA+UVB protection) and wide brim hats (4-inch brim around the entire circumference of the hat). Call for new or changing lesions.  Destruction of lesion - L upper forehead x 1, L lateral eyebrow x 1  Destruction method: cryotherapy   Informed consent: discussed and consent obtained   Lesion destroyed using liquid nitrogen: Yes   Region frozen until ice ball extended beyond lesion: Yes   Outcome: patient tolerated procedure well with no complications   Post-procedure details: wound care instructions given   Additional details:  Prior to procedure, discussed risks of  blister formation, small wound, skin dyspigmentation, or rare scar following cryotherapy. Recommend Vaseline ointment to treated areas while healing.  Skin cancer screening performed today.  Actinic Damage - chronic, secondary to cumulative UV radiation exposure/sun exposure over time - diffuse scaly erythematous macules with underlying dyspigmentation - Recommend daily broad spectrum sunscreen SPF 30+ to sun-exposed areas, reapply every 2 hours as needed.  - Recommend staying in the shade or wearing long sleeves, sun glasses (UVA+UVB protection) and wide brim hats (4-inch brim around the entire circumference of the hat). - Call for new or changing lesions.  Seborrheic Keratoses - Stuck-on, waxy, tan-brown papules and/or plaques  - Benign-appearing - Discussed benign etiology and prognosis. - Observe - Call for any changes  Hemangiomas - Red papules - Discussed benign nature - Observe - Call for any changes  Xerosis - diffuse xerotic patches - recommend gentle, hydrating skin care - recommend Curel hydrotherapy since she doesn't like using creams on skin.  History of Skin Cancer - multiple of the face. Mohs scar on left cheek- clear. Will request records from previous dermatologist.  Clear. Observe for recurrence.  Call clinic for new or changing lesions.   Recommend regular skin exams, daily broad-spectrum spf 30+ sunscreen use, and photoprotection.     Return in about 6 months (  around 10/28/2021) for sun exposed areas, recheck L upper arm, f/up eczema.  IJamesetta Orleans, CMA, am acting as scribe for Brendolyn Patty, MD .  Documentation: I have reviewed the above documentation for accuracy and completeness, and I agree with the above.  Brendolyn Patty MD

## 2021-04-29 NOTE — Patient Instructions (Addendum)
Continue Clobetasol cream twice a day to rash on back until improved. Avoid face, groin, underarms, and body folds.   Topical steroids (such as triamcinolone, fluocinolone, fluocinonide, mometasone, clobetasol, halobetasol, betamethasone, hydrocortisone) can cause thinning and lightening of the skin if they are used for too long in the same area. Your physician has selected the right strength medicine for your problem and area affected on the body. Please use your medication only as directed by your physician to prevent side effects.   Start Elidel Cream (pimecrolimus) Apply to rash on face, body folds, abdomen/body twice a day until improved. Prescription sent to [facility].   Recommend Curel Hydra Therapy itch defense in shower wet skin lotion (over the counter)  Dry Skin Care  What causes dry skin?  Dry skin is common and results from inadequate moisture in the outer skin layers. Dry skin usually results from the excessive loss of moisture from the skin surface. This occurs due to two major factors: Normally the skin's oil glands deposit a layer of oil on the skin's surface. This layer of oil prevents the loss of moisture from the skin. Exposure to soaps, cleaners, solvents, and disinfectants removes this oily film, allowing water to escape. Water loss from the skin increases when the humidity is low. During winter months we spend a lot of time indoors where the air is heated. Heated air has very low humidity. This also contributes to dry skin.  A tendency for dry skin may accompany such disorders as eczema. Also, as people age, the number of functioning oil glands decreases, and the tendency toward dry skin can be a sensation of skin tightness when emerging from the shower.  How do I manage dry skin?  Humidify your environment. This can be accomplished by using a humidifier in your bedroom at night during winter months. Bathing can actually put moisture back into your skin if done  right. Take the following steps while bathing to sooth dry skin: Avoid hot water, which only dries the skin and makes itching worse. Use warm water. Avoid washcloths or extensive rubbing or scrubbing. Use mild soaps like unscented Dove, Oil of Olay, Cetaphil, Basis, or CeraVe. If you take baths rather than showers, rinse off soap residue with clean water before getting out of tub. Once out of the shower/tub, pat dry gently with a soft towel. Leave your skin damp. While still damp, apply any medicated ointment/cream you were prescribed to the affected areas. After you apply your medicated ointment/cream, then apply your moisturizer to your whole body.This is the most important step in dry skin care. If this is omitted, your skin will continue to be dry. The choice of moisturizer is also very important. In general, lotion will not provider enough moisture to severely dry skin because it is water based. You should use an ointment or cream. Moisturizers should also be unscented. Good choices include Vaseline (plain petrolatum), Aquaphor, Cetaphil, CeraVe, Vanicream, DML Forte, Aveeno moisture, or Eucerin Cream. Bath oils can be helpful, but do not replace the application of moisturizer after the bath. In addition, they make the tub slippery causing an increased risk for falls. Therefore, we do not recommend their use.   Cryotherapy Aftercare  Wash gently with soap and water everyday.   Apply Vaseline and Band-Aid daily until healed.   If you have any questions or concerns for your doctor, please call our main line at [phone] and press option 4 to reach your doctor's medical assistant. If no one  answers, please leave a voicemail as directed and we will return your call as soon as possible. Messages left after 4 pm will be answered the following business day.   You may also send [place] a message via [place]. We typically respond to MyChart messages within 1-2 business days.  For prescription refills,  please ask your pharmacy to contact our office. Our fax number is [phone].  If you have an urgent issue when the clinic is closed that cannot wait until the next business day, you can page your doctor at the number below.    Please note that while we do our best to be available for urgent issues outside of office hours, we are not available 24/7.   If you have an urgent issue and are unable to reach [place], you may choose to seek medical care at your doctor's office, retail clinic, urgent care center, or emergency room.  If you have a medical emergency, please immediately call 911 or go to the emergency department.  Pager Numbers  - Dr. Nehemiah Massed: [phone]  - Dr. Laurence Ferrari: [phone]  - Dr. Nicole Kindred: [phone]  In the event of inclement weather, please call our main line at [phone] for an update on the status of any delays or closures.  Dermatology Medication Tips: Please keep the boxes that topical medications come in in order to help keep track of the instructions about where and how to use these. Pharmacies typically print the medication instructions only on the boxes and not directly on the medication tubes.   If your medication is too expensive, please contact our office at [phone] option 4 or send [place] a message through [place].   We are unable to tell what your co-pay for medications will be in advance as this is different depending on your insurance coverage. However, we may be able to find a substitute medication at lower cost or fill out paperwork to get insurance to cover a needed medication.   If a prior authorization is required to get your medication covered by your insurance company, please allow [place] 1-2 business days to complete this process.  Drug prices often vary depending on where the prescription is filled and some pharmacies may offer cheaper prices.  The website www.goodrx.com contains coupons for medications through different pharmacies. The prices  here do not account for what the cost may be with help from insurance (it may be cheaper with your insurance), but the website can give you the price if you did not use any insurance.  - You can print the associated coupon and take it with your prescription to the pharmacy.  - You may also stop by our office during regular business hours and pick up a GoodRx coupon card.  - If you need your prescription sent electronically to a different pharmacy, notify our office through [facility] or by phone at [phone] option 4.

## 2021-05-01 ENCOUNTER — Telehealth: Payer: Self-pay

## 2021-05-01 DIAGNOSIS — L309 Dermatitis, unspecified: Secondary | ICD-10-CM

## 2021-05-01 NOTE — Telephone Encounter (Signed)
Pimecrolimus denied by pt insurance. Pt must try and fail one of the following -  TMC 0.025%, 0.1%, 0.5% Mometasone Betamethasone dipropionate

## 2021-05-06 ENCOUNTER — Ambulatory Visit: Payer: Medicare Other | Admitting: Dermatology

## 2021-05-07 MED ORDER — MOMETASONE FUROATE 0.1 % EX CREA: TOPICAL_CREAM | CUTANEOUS | 0 refills | Status: DC

## 2021-05-07 NOTE — Addendum Note (Signed)
Addended by: Harriett Sine on: 05/07/2021 10:31 AM   Modules accepted: Orders

## 2021-09-26 ENCOUNTER — Other Ambulatory Visit: Payer: Self-pay | Admitting: Internal Medicine

## 2021-09-26 DIAGNOSIS — Z1231 Encounter for screening mammogram for malignant neoplasm of breast: Secondary | ICD-10-CM

## 2021-11-11 ENCOUNTER — Ambulatory Visit: Payer: Medicare Other | Admitting: Dermatology

## 2021-11-27 ENCOUNTER — Ambulatory Visit (INDEPENDENT_AMBULATORY_CARE_PROVIDER_SITE_OTHER): Payer: Medicare Other | Admitting: Dermatology

## 2021-11-27 DIAGNOSIS — L719 Rosacea, unspecified: Secondary | ICD-10-CM | POA: Diagnosis not present

## 2021-11-27 DIAGNOSIS — L309 Dermatitis, unspecified: Secondary | ICD-10-CM

## 2021-11-27 DIAGNOSIS — I781 Nevus, non-neoplastic: Secondary | ICD-10-CM | POA: Diagnosis not present

## 2021-11-27 MED ORDER — BETAMETHASONE DIPROPIONATE AUG 0.05 % EX CREA: TOPICAL_CREAM | CUTANEOUS | 1 refills | Status: DC

## 2021-11-27 MED ORDER — METRONIDAZOLE 0.75 % EX CREA: TOPICAL_CREAM | Freq: Every day | CUTANEOUS | 3 refills | Status: AC

## 2021-11-27 NOTE — Progress Notes (Signed)
? ?Follow-Up Visit ?  ?Subjective  ?Kerry George is a 81 y.o. female who presents for the following: Follow-up (6 month follow up on eczema at abdomen, forehead and back, using mometasone cream and elidil. Patient reports a spot at left lower leg, spots at nose).  Itchy patch on back never clears up.  Clobetasol helps, but was too expensive, so is using mometasone cream instead.  Has a hard time reaching her back to apply the cream.  Only uses the cream for the body folds as needed, not on a regular basis.  Not sure which cream she is using there but thinks it is the mometasone cream. ? ?The patient has spots, moles and lesions to be evaluated, some may be new or changing and the patient has concerns that these could be cancer. ? ? ?The following portions of the chart were reviewed this encounter and updated as appropriate:   ?  ? ?Review of Systems: No other skin or systemic complaints except as noted in HPI or Assessment and Plan. ? ? ?Objective  ?Well appearing patient in no apparent distress; mood and affect are within normal limits. ? ?A focused examination was performed including face, left lower leg, back, abdomen, b/l arms. Relevant physical exam findings are noted in the Assessment and Plan. ? ?abdomen, back, forehead ?Lichenified pink patches at left spinal mid back and left lower abdomen ? ?nose, malar cheeks ?Pink inflammatory papules on nose with erythema on malar cheeks ? ? ?Assessment & Plan  ?Dermatitis ?abdomen, back, forehead ? ?Chronic and persistent condition with duration or expected duration over one year. Condition is bothersome/symptomatic for patient. Currently flared. ? ?With [facility] - back ? ?Atopic dermatitis (eczema) is a chronic, relapsing, pruritic condition that can significantly affect quality of life. It is often associated with allergic rhinitis and/or asthma and can require treatment with topical medications, phototherapy, or in severe cases a biologic medication called Dupixent in  children and adults.  ?Recommend mild soap and moisturizing cream 1-2 times daily. ? ?Start augmented betamethasone dipropionate 0.05 % cream apply to aa's of body (back) qd to bid prn for flares. Caution skin atrophy with long-term use. Avoid f/g/a/body folds ?Continue  Elidel Cream Apply to rash on face, body folds, abdomen BID until improved (pt not sure if she has this one or the mometasone for the body folds) ?Continue mometasone 0.1 % cream apply to aa's face and body qd for up to 1 week prn. Risk of thinning skin with long term use.   ?  ?Topical steroids (such as triamcinolone, fluocinolone, fluocinonide, mometasone, clobetasol, halobetasol, betamethasone, hydrocortisone) can cause thinning and lightening of the skin if they are used for too long in the same area. Your physician has selected the right strength medicine for your problem and area affected on the body. Please use your medication only as directed by your physician to prevent side effects.   ? ?Recommend mild soap and moisturizing cream 1-2 times daily.  Gentle skin care handout provided.   ? ? ?augmented betamethasone dipropionate (DIPROLENE-AF) 0.05 % cream - abdomen, back, forehead ?Apply to aa's  of body qd to bid prn when flared ? ?Related Medications ?pimecrolimus (ELIDEL) 1 % cream ?Apply to rash on face and body folds twice a day until improved. ? ?Rosacea ?nose, malar cheeks ? ?Chronic and persistent condition with duration or expected duration over one year. Condition is bothersome/symptomatic for patient. Currently flared.  ? ?Rosacea is a chronic progressive skin condition usually affecting the face  of adults, causing redness and/or acne bumps. It is treatable but not curable. It sometimes affects the eyes (ocular rosacea) as well. It may respond to topical and/or systemic medication and can flare with stress, sun exposure, alcohol, exercise and some foods.  Daily application of broad spectrum spf 30+ sunscreen to face is recommended  to reduce flares. ? ?Recommend using moisturizing sunscreen (Elta MD clear tinted) every morning. ?Start metrocream 0.75% apply to aa's of face qhs ? ? ?metroNIDAZOLE (METROCREAM) 0.75 % cream - nose, malar cheeks ?Apply topically at bedtime. Apply to cheeks and nose nightly for rosacea ? ? ?Telangiectasia at left lateral calf ?- Dilated blood vessel, blanches ?- Benign appearing on exam ?- Call for changes ? ?Return in about 6 months (around 05/30/2022) for TBSE, . ?I, Ruthell Rummage, CMA, am acting as scribe for Brendolyn Patty, MD. ? ?Documentation: I have reviewed the above documentation for accuracy and completeness, and I agree with the above. ? ?Brendolyn Patty MD  ? ?

## 2021-11-27 NOTE — Patient Instructions (Addendum)
Start Metro cream - apply topically to nose and cheeks nightly for rosacea ?In the morning use a good moisturizer spf like Elta MD Clear tinted  ? ? ? ?Rosacea ? ?What is rosacea? ?Rosacea (say: ro-zay-sha) is a common skin disease that usually begins as a trend of flushing or blushing easily.  As rosacea progresses, a persistent redness in the center of the face will develop and may gradually spread beyond the nose and cheeks to the forehead and chin.  In some cases, the ears, chest, and back could be affected.  Rosacea may appear as tiny blood vessels or small red bumps that occur in crops.  Frequently they can contain pus, and are called ?pustules?.  If the bumps do not contain pus, they are referred to as ?papules?.  Rarely, in prolonged, untreated cases of rosacea, the oil glands of the nose and cheeks may become permanently enlarged.  This is called rhinophyma, and is seen more frequently in men. ? ?Signs and Risks ?In its beginning stages, rosacea tends to come and go, which makes it difficult to recognize.  It can start as intermittent flushing of the face.  Eventually, blood vessels may become permanently visible.  Pustules and papules can appear, but can be mistaken for adult acne.  People of all races, ages, genders and ethnic groups are at risk of developing rosacea.  However, it is more common in women (especially around menopause) and adults with fair skin between the ages of 29 and 73. ? ?Treatment ?Dermatologists typically recommend a combination of treatments to effectively manage rosacea.  Treatment can improve symptoms and may stop the progression of the rosacea.  Treatment may involve both topical and oral medications.  The tetracycline antibiotics are often used for their anti-inflammatory effect; however, because of the possibility of developing antibiotic resistance, they should not be used long term at full dose.  For dilated blood vessels the options include electrodessication (uses electric  current through a small needle), laser treatment, and cosmetics to hide the redness.   ?With all forms of treatment, improvement is a slow process, and patients may not see any results for the first 3-4 weeks.  It is very important to avoid the sun and other triggers.  Patients must wear sunscreen daily. ? ?Skin Care Instructions: ?Cleanse the skin with a mild soap such as CeraVe cleanser, Cetaphil cleanser, or Dove soap once or twice daily as needed. ?Moisturize with Eucerin Redness Relief Daily Perfecting Lotion (has a subtle green tint), CeraVe Moisturizing Cream, or Oil of Olay Daily Moisturizer with sunscreen every morning and/or night as recommended. ?Makeup should be ?non-comedogenic? (won?t clog pores) and be labeled ?for sensitive skin?Kermit Balo choices for cosmetics are: Neutrogena, Almay, and Physician?s Formula.  Any product with a green tint tends to offset a red complexion. ?If your eyes are dry and irritated, use artificial tears 2-3 times per day and cleanse the eyelids daily with baby shampoo.  Have your eyes examined at least every 2 years.  Be sure to tell your eye doctor that you have rosacea. ?Alcoholic beverages tend to cause flushing of the skin, and may make rosacea worse. ?Always wear sunscreen, protect your skin from extreme hot and cold temperatures, and avoid spicy foods, hot drinks, and mechanical irritation such as rubbing, scrubbing, or massaging the face.  Avoid harsh skin cleansers, cleansing masks, astringents, and exfoliation. If a particular product burns or makes your face feel tight, then it is likely to flare your rosacea. ?If you are  having difficulty finding a sunscreen that you can tolerate, you may try switching to a chemical-free sunscreen.  These are ones whose active ingredient is zinc oxide or titanium dioxide only.  They should also be fragrance free, non-comedogenic, and labeled for sensitive skin. ?Rosacea triggers may vary from person to person.  There are a variety of  foods that have been reported to trigger rosacea.  Some patients find that keeping a diary of what they were doing when they flared helps them avoid triggers. ? ?For Dermatitis / eczema ? ?When flared  ?Start augmented betamethasone dipropionate 0.05% cream - apply topically to body when flared ?Continue mometasone 0.1 % cream - apply daily to face and body folds for up to one week at time as needed. Risk of thinning skin with long term use ?Continue Elidel Cream - apply to affected areas of body  ? ?Topical steroids (such as triamcinolone, fluocinolone, fluocinonide, mometasone, clobetasol, halobetasol, betamethasone, hydrocortisone) can cause thinning and lightening of the skin if they are used for too long in the same area. Your physician has selected the right strength medicine for your problem and area affected on the body. Please use your medication only as directed by your physician to prevent side effects.  ? ? ? ?If You Need Anything After Your Visit ? ?If you have any questions or concerns for your doctor, please call our main line at [phone] and press option 4 to reach your doctor's medical assistant. If no one answers, please leave a voicemail as directed and we will return your call as soon as possible. Messages left after 4 pm will be answered the following business day.  ? ?You may also send [place] a message via MyChart. We typically respond to MyChart messages within 1-2 business days. ? ?For prescription refills, please ask your pharmacy to contact our office. Our fax number is [phone]. ? ?If you have an urgent issue when the clinic is closed that cannot wait until the next business day, you can page your doctor at the number below.   ? ?Please note that while we do our best to be available for urgent issues outside of office hours, we are not available 24/7.  ? ?If you have an urgent issue and are unable to reach [place], you may choose to seek medical care at your doctor's office, retail clinic,  urgent care center, or emergency room. ? ?If you have a medical emergency, please immediately call 911 or go to the emergency department. ? ?Pager Numbers ? ?- Dr. Nehemiah Massed: [phone] ? ?- Dr. Laurence Ferrari: [phone] ? ?- Dr. Nicole Kindred: [phone] ? ?In the event of inclement weather, please call our main line at [phone] for an update on the status of any delays or closures. ? ?Dermatology Medication Tips: ?Please keep the boxes that topical medications come in in order to help keep track of the instructions about where and how to use these. Pharmacies typically print the medication instructions only on the boxes and not directly on the medication tubes.  ? ?If your medication is too expensive, please contact our office at [phone] option 4 or send [place] a message through [place].  ? ?We are unable to tell what your co-pay for medications will be in advance as this is different depending on your insurance coverage. However, we may be able to find a substitute medication at lower cost or fill out paperwork to get insurance to cover a needed medication.  ? ?If a prior authorization is required to get  your medication covered by your insurance company, please allow [place] 1-2 business days to complete this process. ? ?Drug prices often vary depending on where the prescription is filled and some pharmacies may offer cheaper prices. ? ?The website www.goodrx.com contains coupons for medications through different pharmacies. The prices here do not account for what the cost may be with help from insurance (it may be cheaper with your insurance), but the website can give you the price if you did not use any insurance.  ?- You can print the associated coupon and take it with your prescription to the pharmacy.  ?- You may also stop by our office during regular business hours and pick up a GoodRx coupon card.  ?- If you need your prescription sent electronically to a different pharmacy, notify our office through [facility] or by phone at [phone] option 4. ? ? ? ? ?Si Usted Necesita Algo Despu?s de Su Visita ? ?Tambi?n puede enviarnos un mensaje a trav?s de MyChart. Por lo general respondemos a los mensajes d

## 2021-12-06 ENCOUNTER — Ambulatory Visit
Admission: RE | Admit: 2021-12-06 | Discharge: 2021-12-06 | Disposition: A | Discharge status: Home / Self Care | Payer: Medicare Other | Source: Ambulatory Visit | Attending: Internal Medicine | Admitting: Internal Medicine

## 2021-12-06 DIAGNOSIS — Z1231 Encounter for screening mammogram for malignant neoplasm of breast: Secondary | ICD-10-CM | POA: Diagnosis present

## 2022-01-24 ENCOUNTER — Other Ambulatory Visit: Payer: Self-pay | Admitting: Neurosurgery

## 2022-01-24 DIAGNOSIS — G8929 Other chronic pain: Secondary | ICD-10-CM

## 2022-05-27 ENCOUNTER — Ambulatory Visit (INDEPENDENT_AMBULATORY_CARE_PROVIDER_SITE_OTHER): Payer: Medicare Other | Admitting: Dermatology

## 2022-05-27 DIAGNOSIS — L578 Other skin changes due to chronic exposure to nonionizing radiation: Secondary | ICD-10-CM

## 2022-05-27 DIAGNOSIS — D229 Melanocytic nevi, unspecified: Secondary | ICD-10-CM

## 2022-05-27 DIAGNOSIS — Z85828 Personal history of other malignant neoplasm of skin: Secondary | ICD-10-CM

## 2022-05-27 DIAGNOSIS — L853 Xerosis cutis: Secondary | ICD-10-CM

## 2022-05-27 DIAGNOSIS — L719 Rosacea, unspecified: Secondary | ICD-10-CM | POA: Diagnosis not present

## 2022-05-27 DIAGNOSIS — Z1283 Encounter for screening for malignant neoplasm of skin: Secondary | ICD-10-CM

## 2022-05-27 DIAGNOSIS — L814 Other melanin hyperpigmentation: Secondary | ICD-10-CM

## 2022-05-27 DIAGNOSIS — L309 Dermatitis, unspecified: Secondary | ICD-10-CM | POA: Diagnosis not present

## 2022-05-27 DIAGNOSIS — L720 Epidermal cyst: Secondary | ICD-10-CM

## 2022-05-27 DIAGNOSIS — L821 Other seborrheic keratosis: Secondary | ICD-10-CM

## 2022-05-27 DIAGNOSIS — L570 Actinic keratosis: Secondary | ICD-10-CM

## 2022-05-27 NOTE — Patient Instructions (Addendum)
Recommend Neutrogena hydro boost gel cream for moisturizer.     Gentle Skin Care Guide  1. Bathe no more than once a day.  2. Avoid bathing in hot water  3. Use a mild soap like Dove, Vanicream, Cetaphil, CeraVe. Can use Lever 2000 or Cetaphil antibacterial soap  4. Use soap only where you need it. On most days, use it under your arms, between your legs, and on your feet. Let the water rinse other areas unless visibly dirty.  5. When you get out of the bath/shower, use a towel to gently blot your skin dry, don't rub it.  6. While your skin is still a little damp, apply a moisturizing cream such as Vanicream, CeraVe, Cetaphil, Eucerin, Sarna lotion or plain Vaseline Jelly. For hands apply Neutrogena [place] Hand Cream or Excipial Hand Cream.  7. Reapply moisturizer any time you start to itch or feel dry.  8. Sometimes using free and clear laundry detergents can be helpful. Fabric softener sheets should be avoided. Downy Free & Gentle liquid, or any liquid fabric softener that is free of dyes and perfumes, it acceptable to use  9. If your doctor has given you prescription creams you may apply moisturizers over them      Actinic keratoses are precancerous spots that appear secondary to cumulative UV radiation exposure/sun exposure over time. They are chronic with expected duration over 1 year. A portion of actinic keratoses will progress to squamous cell carcinoma of the skin. It is not possible to reliably predict which spots will progress to skin cancer and so treatment is recommended to prevent development of skin cancer.  Recommend daily broad spectrum sunscreen SPF 30+ to sun-exposed areas, reapply every 2 hours as needed.  Recommend staying in the shade or wearing long sleeves, sun glasses (UVA+UVB protection) and wide brim hats (4-inch brim around the entire circumference of the hat). Call for new or changing lesions.   Cryotherapy Aftercare  Wash gently with soap and water  everyday.   Apply Vaseline and Band-Aid daily until healed.    Seborrheic Keratosis  What causes seborrheic keratoses? Seborrheic keratoses are harmless, common skin growths that first appear during adult life.  As time goes by, more growths appear.  Some people may develop a large number of them.  Seborrheic keratoses appear on both covered and uncovered body parts.  They are not caused by sunlight.  The tendency to develop seborrheic keratoses can be inherited.  They vary in color from skin-colored to gray, brown, or even black.  They can be either smooth or have a rough, warty surface.   Seborrheic keratoses are superficial and look as if they were stuck on the skin.  Under the microscope this type of keratosis looks like layers upon layers of skin.  That is why at times the top layer may seem to fall off, but the rest of the growth remains and re-grows.    Treatment Seborrheic keratoses do not need to be treated, but can easily be removed in the office.  Seborrheic keratoses often cause symptoms when they rub on clothing or jewelry.  Lesions can be in the way of shaving.  If they become inflamed, they can cause itching, soreness, or burning.  Removal of a seborrheic keratosis can be accomplished by freezing, burning, or surgery. If any spot bleeds, scabs, or grows rapidly, please return to have it checked, as these can be an indication of a skin cancer.    Melanoma ABCDEs  Melanoma is the  most dangerous type of skin cancer, and is the leading cause of death from skin disease.  You are more likely to develop melanoma if you: Have light-colored skin, light-colored eyes, or red or blond hair Spend a lot of time in the sun Tan regularly, either outdoors or in a tanning bed Have had blistering sunburns, especially during childhood Have a close family member who has had a melanoma Have atypical moles or large birthmarks  Early detection of melanoma is key since treatment is typically  straightforward and cure rates are extremely high if we catch it early.   The first sign of melanoma is often a change in a mole or a new dark spot.  The ABCDE system is a way of remembering the signs of melanoma.  A for asymmetry:  The two halves do not match. B for border:  The edges of the growth are irregular. C for color:  A mixture of colors are present instead of an even brown color. D for diameter:  Melanomas are usually (but not always) greater than 56m - the size of a pencil eraser. E for evolution:  The spot keeps changing in size, shape, and color.  Please check your skin once per month between visits. You can use a small mirror in front and a large mirror behind you to keep an eye on the back side or your body.   If you see any new or changing lesions before your next follow-up, please call to schedule a visit.  Please continue daily skin protection including broad spectrum sunscreen SPF 30+ to sun-exposed areas, reapplying every 2 hours as needed when you're outdoors.   Staying in the shade or wearing long sleeves, sun glasses (UVA+UVB protection) and wide brim hats (4-inch brim around the entire circumference of the hat) are also recommended for sun protection.    Due to recent changes in healthcare laws, you may see results of your pathology and/or laboratory studies on MyChart before the doctors have had a chance to review them. We understand that in some cases there may be results that are confusing or concerning to you. Please understand that not all results are received at the same time and often the doctors may need to interpret multiple results in order to provide you with the best plan of care or course of treatment. Therefore, we ask that you please give u[place]2 business days to thoroughly review all your results before contacting the office for clarification. Should we see a critical lab result, you will be contacted sooner.   If You Need Anything After Your Visit  If you  have any questions or concerns for your doctor, please call our main line at [phone] press option 4 to reach your doctor's medical assistant. If no one answers, please leave a voicemail as directed and we will return your call as soon as possible. Messages left after 4 pm will be answered the following business day.   You may also send [place] message via [place] We typically respond to MyChart messages within 1-2 business days.  For prescription refills, please ask your pharmacy to contact our office. Our fax number is [phone]  If you have an urgent issue when the clinic is closed that cannot wait until the next business day, you can page your doctor at the number below.    Please note that while we do our best to be available for urgent issues outside of office hours, we are not available 24/7.  If you have an urgent issue and are unable to reach [place], you may choose to seek medical care at your doctor's office, retail clinic, urgent care center, or emergency room.  If you have a medical emergency, please immediately call 911 or go to the emergency department.  Pager Numbers  - Dr. Nehemiah Massed: [phone]  - Dr. Laurence Ferrari: [phone]  - Dr. Nicole Kindred: [phone]  In the event of inclement weather, please call our main line at [phone] for an update on the status of any delays or closures.  Dermatology Medication Tips: Please keep the boxes that topical medications come in in order to help keep track of the instructions about where and how to use these. Pharmacies typically print the medication instructions only on the boxes and not directly on the medication tubes.   If your medication is too expensive, please contact our office at [phone] option 4 or send [place] a message through [place].   We are unable to tell what your co-pay for medications will be in advance as this is different depending on your insurance coverage. However, we may be able to find a substitute  medication at lower cost or fill out paperwork to get insurance to cover a needed medication.   If a prior authorization is required to get your medication covered by your insurance company, please allow [place] 1-2 business days to complete this process.  Drug prices often vary depending on where the prescription is filled and some pharmacies may offer cheaper prices.  The website www.goodrx.com contains coupons for medications through different pharmacies. The prices here do not account for what the cost may be with help from insurance (it may be cheaper with your insurance), but the website can give you the price if you did not use any insurance.  - You can print the associated coupon and take it with your prescription to the pharmacy.  - You may also stop by our office during regular business hours and pick up a GoodRx coupon card.  - If you need your prescription sent electronically to a different pharmacy, notify our office through [facility] or by phone at [phone] option 4.     Si Usted Necesita Algo Despus de Su Visita  Tambin puede enviarnos un mensaje a travs de Pharmacist, community. Por lo general respondemos a los mensajes de MyChart en el transcurso de 1 a 2 das hbiles.  Para renovar recetas, por favor pida a su farmacia que se ponga en contacto con nuestra oficina. Harland Dingwall de fax es [place] [phone].  Si tiene un asunto urgente cuando la clnica est cerrada y que no puede esperar hasta el siguiente da hbil, puede llamar/localizar a su doctor(a) al nmero que aparece a continuacin.   Por favor, tenga en cuenta que aunque hacemos todo lo posible para estar disponibles para asuntos urgentes fuera del horario de [place], no estamos disponibles las 24 horas del da, los 7 das de la [place].   Si tiene un problema urgente y no puede comunicarse con nosotros, puede optar por buscar atencin mdica  en el consultorio de su doctor(a), en una clnica privada, en un centro de  atencin urgente o en una sala de emergencias.  Si tiene Engineering geologist, por favor llame inmediatamente al 911 o vaya a la sala de emergencias.  Nmeros de bper  - Dr. Nehemiah Massed: [phone]  - Dra. Moye: [phone]  - Dra. Nicole Kindred: [phone]  En caso de inclemencias del tiempo, por favor llame a nuestra lnea principal  al [phone] para una actualizacin sobre el [place] de cualquier retraso o cierre.  Consejos para la medicacin en dermatologa: Por favor, guarde las cajas en las que vienen los medicamentos de uso tpico para ayudarle a seguir las instrucciones sobre dnde y cmo usarlos. Las farmacias generalmente imprimen las instrucciones del medicamento slo en las cajas y no directamente en los tubos del [place].   Si su medicamento es muy caro, por favor, pngase en contacto con Zigmund Daniel llamando al [phone] y presione la opcin 4 o envenos un mensaje a travs de Pharmacist, community.   No podemos decirle cul ser su copago por los medicamentos por adelantado ya que esto es diferente dependiendo de la cobertura de su seguro. Sin embargo, es posible que podamos encontrar un medicamento sustituto a Electrical engineer un formulario para que el seguro cubra el medicamento que se considera necesario.   Si se requiere una autorizacin previa para que su compaa de seguros [place] su medicamento, por favor permtanos de 1 a 2 das hbiles para completar este proceso.  Los precios de los medicamentos varan con frecuencia dependiendo del Environmental consultant de dnde se surte la receta y alguna farmacias pueden ofrecer precios ms baratos.  El sitio web www.goodrx.com tiene cupones para medicamentos de Airline pilot. Los precios aqu no tienen en cuenta lo que podra costar con la ayuda del seguro (puede ser ms barato con su seguro), pero el sitio web puede darle el precio si no utiliz Research scientist (physical sciences).  - Puede imprimir el cupn correspondiente y llevarlo con su receta a la  farmacia.  - Tambin puede pasar por nuestra oficina durante el horario de atencin regular y Charity fundraiser una tarjeta de cupones de GoodRx.  - Si necesita que su receta se enve electrnicamente a una farmacia diferente, informe a nuestra oficina a travs de MyChart de [place] o por telfono llamando al [phone] y presione la opcin 4.

## 2022-05-27 NOTE — Progress Notes (Signed)
Follow-Up Visit   Subjective  Nakai Yard is a 81 y.o. female who presents for the following: Annual Exam (6 month tbse, hx of rosacea, hx of dermatitis. Patient reports flaky areas above eyebrow, dryness at face, dry area at left cheek, and spot at base of right thumb. ).  The patient presents for Total-Body Skin Exam (TBSE) for skin cancer screening and mole check.  The patient has spots, moles and lesions to be evaluated, some may be new or changing and the patient has concerns that these could be cancer.   The following portions of the chart were reviewed this encounter and updated as appropriate:      Review of Systems: No other skin or systemic complaints except as noted in HPI or Assessment and Plan.   Objective  Well appearing patient in no apparent distress; mood and affect are within normal limits.  A full examination was performed including scalp, head, eyes, ears, nose, lips, neck, chest, axillae, abdomen, back, buttocks, bilateral upper extremities, bilateral lower extremities, hands, feet, fingers, toes, fingernails, and toenails. All findings within normal limits unless otherwise noted below.  palmer base of right thumb 1 cm firm subcutaneous nodule with central bluish discoloration  right upper eyebrow x 1, left temple x 2 (3) Pink scaly macule   mid back, chest and abdomen With pink brown lichenified patches mid back with excoriations Pink scaly patches on chest and abdomen  malar cheeks and nose Mild erythema   Assessment & Plan  Epidermal inclusion cyst palmer base of right thumb  Benign-appearing. Exam most consistent with an epidermal inclusion cyst. Discussed that a cyst is a benign growth that can grow over time and sometimes get irritated or inflamed. Recommend observation if it is not bothersome. Discussed option of surgical excision to remove it if it is growing, symptomatic, or other changes noted. Please call for new or changing lesions so they can  be evaluated.  Patient reports irritating, growing- recommend referral to hand surgeon    Ambulatory referral to Orthopedic Surgery - palmer base of right thumb  Actinic keratosis (3) right upper eyebrow x 1, left temple x 2  Actinic keratoses are precancerous spots that appear secondary to cumulative UV radiation exposure/sun exposure over time. They are chronic with expected duration over 1 year. A portion of actinic keratoses will progress to squamous cell carcinoma of the skin. It is not possible to reliably predict which spots will progress to skin cancer and so treatment is recommended to prevent development of skin cancer.  Recommend daily broad spectrum sunscreen SPF 30+ to sun-exposed areas, reapply every 2 hours as needed.  Recommend staying in the shade or wearing long sleeves, sun glasses (UVA+UVB protection) and wide brim hats (4-inch brim around the entire circumference of the hat). Call for new or changing lesions.  Destruction of lesion - right upper eyebrow x 1, left temple x 2  Destruction method: cryotherapy   Informed consent: discussed and consent obtained   Lesion destroyed using liquid nitrogen: Yes   Region frozen until ice ball extended beyond lesion: Yes   Outcome: patient tolerated procedure well with no complications   Post-procedure details: wound care instructions given   Additional details:  Prior to procedure, discussed risks of blister formation, small wound, skin dyspigmentation, or rare scar following cryotherapy. Recommend Vaseline ointment to treated areas while healing.   Dermatitis mid back, chest and abdomen  With [facility], Chronic and persistent condition with duration or expected duration over one year.  Condition is symptomatic/ bothersome to patient. Not currently at goal.   Atopic dermatitis (eczema) is a chronic, relapsing, pruritic condition that can significantly affect quality of life. It is often associated with allergic rhinitis and/or  asthma and can require treatment with topical medications, phototherapy, or in severe cases biologic injectable medication (Dupixent; Adbry) or Oral JAK inhibitors.  Continue augmented betamethasone dipropionate 0.05 % cream apply to aa's of body (back) qd to bid prn for flares. Caution skin atrophy with long-term use. Avoid f/g/a/body folds Continue  Elidel Cream Apply to rash on face, body folds, abdomen BID until improved (pt not sure if she has this one or the mometasone for the body folds) Continue mometasone 0.1 % cream apply to aa's face and body folds qd for up to 1 week prn. Risk of thinning skin with long term use.    Patient will call to let us know what creams she needs refill on.    Topical steroids (such as triamcinolone, fluocinolone, fluocinonide, mometasone, clobetasol, halobetasol, betamethasone, hydrocortisone) can cause thinning and lightening of the skin if they are used for too long in the same area. Your physician has selected the right strength medicine for your problem and area affected on the body. Please use your medication only as directed by your physician to prevent side effects.     Related Medications pimecrolimus (ELIDEL) 1 % cream Apply to rash on face and body folds twice a day until improved.  augmented betamethasone dipropionate (DIPROLENE-AF) 0.05 % cream Apply to aa's  of body qd to bid prn when flared  Rosacea malar cheeks and nose  Rosacea is a chronic progressive skin condition usually affecting the face of adults, causing redness and/or acne bumps. It is treatable but not curable. It sometimes affects the eyes (ocular rosacea) as well. It may respond to topical and/or systemic medication and can flare with stress, sun exposure, alcohol, exercise, topical steroids (including hydrocortisone/cortisone 10) and some foods.  Daily application of broad spectrum spf 30+ sunscreen to face is recommended to reduce flares.  Patient is not currently using Rx and  does not feel treatment is needed at this time   Related Medications metroNIDAZOLE (METROCREAM) 0.75 % cream Apply topically at bedtime. Apply to cheeks and nose nightly for rosacea  Lentigines - Scattered tan macules - Due to sun exposure - Benign-appearing, observe - Recommend daily broad spectrum sunscreen SPF 30+ to sun-exposed areas, reapply every 2 hours as needed. - Call for any changes  Seborrheic Keratoses - Stuck-on, waxy, tan-brown papules and/or plaques  - Benign-appearing - Discussed benign etiology and prognosis. - Observe - Call for any changes  Melanocytic Nevi - Tan-brown and/or pink-flesh-colored symmetric macules and papules - Benign appearing on exam today - Observation - Call clinic for new or changing moles - Recommend daily use of broad spectrum spf 30+ sunscreen to sun-exposed areas.   Hemangiomas - Red papules - Discussed benign nature - Observe - Call for any changes  Xerosis - diffuse xerotic patches - recommend gentle, hydrating skin care - gentle skin care handout given   Actinic Damage - Chronic condition, secondary to cumulative UV/sun exposure - diffuse scaly erythematous macules with underlying dyspigmentation - Recommend daily broad spectrum sunscreen SPF 30+ to sun-exposed areas, reapply every 2 hours as needed.  - Staying in the shade or wearing long sleeves, sun glasses (UVA+UVB protection) and wide brim hats (4-inch brim around the entire circumference of the hat) are also recommended for sun protection.  - Call  for new or changing lesions.  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 Mohs scar on left cheek clear  Clear. Observe for recurrence.  Call clinic for new or changing lesions.   Recommend regular skin exams, daily broad-spectrum spf 30+ sunscreen use, and photoprotection.     Skin cancer screening performed today. Return in about 1 year (around 05/28/2023) for TBSE. I, Ruthell Rummage, CMA, am acting as scribe for Brendolyn Patty, MD.  Documentation: I have reviewed the above documentation for accuracy and completeness, and I agree with the above.  Brendolyn Patty MD

## 2022-06-05 ENCOUNTER — Ambulatory Visit: Payer: Medicare Other | Admitting: Nurse Practitioner

## 2022-06-05 VITALS — BP 130/90 | HR 83 | Temp 98.2°F | Wt 177.0 lb

## 2022-06-05 DIAGNOSIS — S90819A Abrasion, unspecified foot, initial encounter: Secondary | ICD-10-CM | POA: Diagnosis not present

## 2022-06-05 NOTE — Progress Notes (Signed)
Careteam: Patient Care Team: Idelle Crouch, MD as PCP - General (Internal Medicine)  Advanced Directive information    Allergies  Allergen Reactions   Ciprofloxacin Other (See Comments)    Upset stomach, did not tolerate   Codeine Other (See Comments) and Itching    dizziness    Octacosanol Other (See Comments)    Hay fever   Oxycodone Other (See Comments)    vertigo   Sulfamethoxazole-Trimethoprim Other (See Comments)    Bleeding from rectum Blood in colon     Chief Complaint  Patient presents with   Acute Visit    Patient fell rug on screened porch. Patient says she thinks she hit foot on iron leg of outdoor furniture. Patient injured foot 2 weeks. Patient has been putting neosporin on it. Wound has drainage and red. Wound is  on outside of great toe.      HPI: Patient is a 81 y.o. female seen in today at the [facility] for wound to base of left toe.  Tripped on rug and had a pretty deep wound 2 weeks ago. she has been working on it for 2 weeks and has redness surrounding wound. The inside of the wound is getting better. Redness has been unchanged. It is very tender. She used neosporin for 2 weeks then stopped a few days ago.  She saw the nurse at [facility] because she was concerned that it was still red but it is not warm.  Continues to be tender but not painful like it was a week ago.   Review of Systems:  Review of Systems  Constitutional:  Negative for chills and fever.  Musculoskeletal:  Negative for joint pain and myalgias.  Skin:        Wound on toe    Past Medical History:  Diagnosis Date   Breast cancer ([place]) 2014   right   Personal history of radiation therapy    Skin cancer    Past Surgical History:  Procedure Laterality Date   BREAST LUMPECTOMY     Social History:   has no history on file for tobacco use, alcohol use, and drug use.  Family History  Problem Relation Age of Onset   Breast cancer Neg Hx      Medications: Patient's Medications  New Prescriptions   No medications on file  Previous Medications   ATORVASTATIN (LIPITOR) 10 MG TABLET    Take 1 tablet by mouth daily.   AUGMENTED BETAMETHASONE DIPROPIONATE (DIPROLENE-AF) 0.05 % CREAM    Apply to aa's  of body qd to bid prn when flared   ESOMEPRAZOLE (NEXIUM) 20 MG CAPSULE    Take 20 mg by mouth daily.   METRONIDAZOLE (METROCREAM) 0.75 % CREAM    Apply topically at bedtime. Apply to cheeks and nose nightly for rosacea   MOMETASONE (ELOCON) 0.1 % CREAM    Apply qd to face and body folds for up to 1 week at a time prn. Risk of thinning the skin with long term use.   PIMECROLIMUS (ELIDEL) 1 % CREAM    Apply to rash on face and body folds twice a day until improved.  Modified Medications   No medications on file  Discontinued Medications   No medications on file    Physical Exam:  Vitals:   06/05/22 0952  BP: (!) 130/90  Pulse: 83  Temp: 98.2 F (36.8 C)  SpO2: 96%  Weight: 177 lb (80.3 kg)   There is no height  or weight on file to calculate BMI. Wt Readings from Last 3 Encounters:  06/05/22 177 lb (80.3 kg)    Physical Exam Constitutional:      Appearance: Normal appearance.  Pulmonary:     Effort: Pulmonary effort is normal.  Musculoskeletal:       Feet:  Feet:     Comments: hallux valgus with open wound, yellow base, mild erythema surrounding area.  Neurological:     Mental Status: She is alert. Mental status is at baseline.  Psychiatric:        Mood and Affect: Mood normal.     Labs reviewed: Basic Metabolic Panel: No results for input(s): "NA", "K", "CL", "CO2", "GLUCOSE", "BUN", "CREATININE", "CALCIUM", "MG", "PHOS", "TSH" in the last 8760 hours. Liver Function Tests: No results for input(s): "AST", "ALT", "ALKPHOS", "BILITOT", "PROT", "ALBUMIN" in the last 8760 hours. No results for input(s): "LIPASE", "AMYLASE" in the last 8760 hours. No results for input(s): "AMMONIA" in the last 8760  hours. CBC: No results for input(s): "WBC", "NEUTROABS", "HGB", "HCT", "MCV", "PLT" in the last 8760 hours. Lipid Panel: No results for input(s): "CHOL", "HDL", "LDLCALC", "TRIG", "CHOLHDL", "LDLDIRECT" in the last 8760 hours. TSH: No results for input(s): "TSH" in the last 8760 hours. A1C: No results found for: "HGBA1C"   Assessment/Plan 1. Abrasion, foot w/o infection -open area to hallux valgus, overall Ms Mehrer reports wound has improved, tenderness better. Pink area around wound is stable and has not worsened.  -discussed wound care- to avoid shoe rubbing area.  - to use xeroform and cover with dressing daily -strict follow up precautions given- if wound stops healing, erythema or pain worsens, drainage occurs to follow up.    Carlos American. [place], [facility] [phone]

## 2022-06-05 NOTE — Patient Instructions (Signed)
Continue to monitor wound for worsening redness, purulent drainage, warmth or increase in tenderness.  To use xeroform dressing, cover with bandaid.

## 2022-11-13 ENCOUNTER — Other Ambulatory Visit: Payer: Self-pay | Admitting: Internal Medicine

## 2022-11-13 DIAGNOSIS — Z1231 Encounter for screening mammogram for malignant neoplasm of breast: Secondary | ICD-10-CM

## 2022-12-23 ENCOUNTER — Ambulatory Visit
Admission: RE | Admit: 2022-12-23 | Discharge: 2022-12-23 | Disposition: A | Discharge status: Home / Self Care | Payer: Medicare Other | Source: Ambulatory Visit | Attending: Internal Medicine | Admitting: Internal Medicine

## 2022-12-23 ENCOUNTER — Ambulatory Visit (INDEPENDENT_AMBULATORY_CARE_PROVIDER_SITE_OTHER): Payer: Medicare Other | Admitting: Dermatology

## 2022-12-23 VITALS — BP 121/72

## 2022-12-23 DIAGNOSIS — K130 Diseases of lips: Secondary | ICD-10-CM

## 2022-12-23 DIAGNOSIS — L820 Inflamed seborrheic keratosis: Secondary | ICD-10-CM

## 2022-12-23 DIAGNOSIS — Z1231 Encounter for screening mammogram for malignant neoplasm of breast: Secondary | ICD-10-CM | POA: Insufficient documentation

## 2022-12-23 MED ORDER — HYDROCORTISONE 2.5 % EX CREA: TOPICAL_CREAM | CUTANEOUS | 1 refills | Status: AC

## 2022-12-23 NOTE — Progress Notes (Signed)
   Follow-Up Visit   Subjective  Kerry George is a 82 y.o. female who presents for the following: check rough spot L perioral area, gets a pimple in area ~72m, used moisturizer    The following portions of the chart were reviewed this encounter and updated as appropriate: medications, allergies, medical history  Review of Systems:  No other skin or systemic complaints except as noted in HPI or Assessment and Plan.  Objective  Well appearing patient in no apparent distress; mood and affect are within normal limits.   A focused examination was performed of the following areas: face  Relevant exam findings are noted in the Assessment and Plan.  L perioral within marionette fold x 1 Stuck on waxy papule with erythema    Assessment & Plan   ANGULAR CHEILITIS Exam: Pink scaly patch from L corner of mouth extending down marionette fold to chin  Treatment Plan: Start HC 2.5% cr qd/bid until clear or up to 2 weeks, then prn flares   Inflamed seborrheic keratosis L perioral within marionette fold x 1  Symptomatic, irritating, patient would like treated.   Destruction of lesion - L perioral within marionette fold x 1  Destruction method: cryotherapy   Informed consent: discussed and consent obtained   Lesion destroyed using liquid nitrogen: Yes   Region frozen until ice ball extended beyond lesion: Yes   Outcome: patient tolerated procedure well with no complications   Post-procedure details: wound care instructions given   Additional details:  Prior to procedure, discussed risks of blister formation, small wound, skin dyspigmentation, or rare scar following cryotherapy. Recommend Vaseline ointment to treated areas while healing.     Return for as scheduled for TBSE.  I, Ardis Rowan, RMA, am acting as scribe for Willeen Niece, MD .   Documentation: I have reviewed the above documentation for accuracy and completeness, and I agree with the above.  Willeen Niece, MD

## 2022-12-23 NOTE — Patient Instructions (Addendum)
Cryotherapy Aftercare  Wash gently with soap and water everyday.   Apply Vaseline and Band-Aid daily until healed.     Due to recent changes in healthcare laws, you may see results of your pathology and/or laboratory studies on MyChart before the doctors have had a chance to review them. We understand that in some cases there may be results that are confusing or concerning to you. Please understand that not all results are received at the same time and often the doctors may need to interpret multiple results in order to provide you with the best plan of care or course of treatment. Therefore, we ask that you please give us 2 business days to thoroughly review all your results before contacting the office for clarification. Should we see a critical lab result, you will be contacted sooner.   If You Need Anything After Your Visit  If you have any questions or concerns for your doctor, please call our main line at [phone] and press option 4 to reach your doctor's medical assistant. If no one answers, please leave a voicemail as directed and we will return your call as soon as possible. Messages left after 4 pm will be answered the following business day.   You may also send us a message via MyChart. We typically respond to MyChart messages within 1-2 business days.  For prescription refills, please ask your pharmacy to contact our office. Our fax number is [phone].  If you have an urgent issue when the clinic is closed that cannot wait until the next business day, you can page your doctor at the number below.    Please note that while we do our best to be available for urgent issues outside of office hours, we are not available 24/7.   If you have an urgent issue and are unable to reach us, you may choose to seek medical care at your doctor's office, retail clinic, urgent care center, or emergency room.  If you have a medical emergency, please immediately call 911 or go to the  emergency department.  Pager Numbers  - Dr. Kowalski: [phone]  - Dr. Moye: [phone]  - Dr. Stewart: [phone]  In the event of inclement weather, please call our main line at [phone] for an update on the status of any delays or closures.  Dermatology Medication Tips: Please keep the boxes that topical medications come in in order to help keep track of the instructions about where and how to use these. Pharmacies typically print the medication instructions only on the boxes and not directly on the medication tubes.   If your medication is too expensive, please contact our office at [phone] option 4 or send us a message through MyChart.   We are unable to tell what your co-pay for medications will be in advance as this is different depending on your insurance coverage. However, we may be able to find a substitute medication at lower cost or fill out paperwork to get insurance to cover a needed medication.   If a prior authorization is required to get your medication covered by your insurance company, please allow us 1-2 business days to complete this process.  Drug prices often vary depending on where the prescription is filled and some pharmacies may offer cheaper prices.  The website www.goodrx.com contains coupons for medications through different pharmacies. The prices here do not account for what the cost may be with help from insurance (it may be cheaper with your insurance), but the website can   give you the price if you did not use any insurance.  - You can print the associated coupon and take it with your prescription to the pharmacy.  - You may also stop by our office during regular business hours and pick up a GoodRx coupon card.  - If you need your prescription sent electronically to a different pharmacy, notify our office through [facility] MyChart or by phone at [phone] option 4.     Si Usted Necesita Algo Despus de Su Visita  Tambin puede  enviarnos un mensaje a travs de MyChart. Por lo general respondemos a los mensajes de MyChart en el transcurso de 1 a 2 das hbiles.  Para renovar recetas, por favor pida a su farmacia que se ponga en contacto con nuestra oficina. Nuestro nmero de fax es el [phone].  Si tiene un asunto urgente cuando la clnica est cerrada y que no puede esperar hasta el siguiente da hbil, puede llamar/localizar a su doctor(a) al nmero que aparece a continuacin.   Por favor, tenga en cuenta que aunque hacemos todo lo posible para estar disponibles para asuntos urgentes fuera del horario de oficina, no estamos disponibles las 24 horas del da, los 7 das de la semana.   Si tiene un problema urgente y no puede comunicarse con nosotros, puede optar por buscar atencin mdica  en el consultorio de su doctor(a), en una clnica privada, en un centro de atencin urgente o en una sala de emergencias.  Si tiene una emergencia mdica, por favor llame inmediatamente al 911 o vaya a la sala de emergencias.  Nmeros de bper  - Dr. Kowalski: [phone]  - Dra. Moye: [phone]  - Dra. Stewart: [phone]  En caso de inclemencias del tiempo, por favor llame a nuestra lnea principal al [phone] para una actualizacin sobre el estado de cualquier retraso o cierre.  Consejos para la medicacin en dermatologa: Por favor, guarde las cajas en las que vienen los medicamentos de uso tpico para ayudarle a seguir las instrucciones sobre dnde y cmo usarlos. Las farmacias generalmente imprimen las instrucciones del medicamento slo en las cajas y no directamente en los tubos del medicamento.   Si su medicamento es muy caro, por favor, pngase en contacto con nuestra oficina llamando al [phone] y presione la opcin 4 o envenos un mensaje a travs de MyChart.   No podemos decirle cul ser su copago por los medicamentos por adelantado ya que esto es diferente dependiendo de la cobertura de su seguro.  Sin embargo, es posible que podamos encontrar un medicamento sustituto a menor costo o llenar un formulario para que el seguro cubra el medicamento que se considera necesario.   Si se requiere una autorizacin previa para que su compaa de seguros cubra su medicamento, por favor permtanos de 1 a 2 das hbiles para completar este proceso.  Los precios de los medicamentos varan con frecuencia dependiendo del lugar de dnde se surte la receta y alguna farmacias pueden ofrecer precios ms baratos.  El sitio web www.goodrx.com tiene cupones para medicamentos de diferentes farmacias. Los precios aqu no tienen en cuenta lo que podra costar con la ayuda del seguro (puede ser ms barato con su seguro), pero el sitio web puede darle el precio si no utiliz ningn seguro.  - Puede imprimir el cupn correspondiente y llevarlo con su receta a la farmacia.  - Tambin puede pasar por nuestra oficina durante el horario de atencin regular y recoger una tarjeta de cupones de GoodRx.  -   Si necesita que su receta se enve electrnicamente a una farmacia diferente, informe a nuestra oficina a travs de MyChart de [facility] o por telfono llamando al [phone] y presione la opcin 4.  

## 2023-02-04 ENCOUNTER — Other Ambulatory Visit: Payer: Self-pay | Admitting: Dermatology

## 2023-02-04 DIAGNOSIS — L309 Dermatitis, unspecified: Secondary | ICD-10-CM

## 2023-02-09 ENCOUNTER — Ambulatory Visit (INDEPENDENT_AMBULATORY_CARE_PROVIDER_SITE_OTHER): Payer: Medicare Other | Admitting: Dermatology

## 2023-02-09 ENCOUNTER — Encounter: Payer: Self-pay | Admitting: Dermatology

## 2023-02-09 VITALS — BP 138/75

## 2023-02-09 DIAGNOSIS — L820 Inflamed seborrheic keratosis: Secondary | ICD-10-CM

## 2023-02-09 DIAGNOSIS — L821 Other seborrheic keratosis: Secondary | ICD-10-CM

## 2023-02-09 NOTE — Patient Instructions (Signed)
Seborrheic Keratosis  What causes seborrheic keratoses? Seborrheic keratoses are harmless, common skin growths that first appear during adult life.  As time goes by, more growths appear.  Some people may develop a large number of them.  Seborrheic keratoses appear on both covered and uncovered body parts.  They are not caused by sunlight.  The tendency to develop seborrheic keratoses can be inherited.  They vary in color from skin-colored to gray, brown, or even black.  They can be either smooth or have a rough, warty surface.   Seborrheic keratoses are superficial and look as if they were stuck on the skin.  Under the microscope this type of keratosis looks like layers upon layers of skin.  That is why at times the top layer may seem to fall off, but the rest of the growth remains and re-grows.    Treatment Seborrheic keratoses do not need to be treated, but can easily be removed in the office.  Seborrheic keratoses often cause symptoms when they rub on clothing or jewelry.  Lesions can be in the way of shaving.  If they become inflamed, they can cause itching, soreness, or burning.  Removal of a seborrheic keratosis can be accomplished by freezing, burning, or surgery. If any spot bleeds, scabs, or grows rapidly, please return to have it checked, as these can be an indication of a skin cancer.  Due to recent changes in healthcare laws, you may see results of your pathology and/or laboratory studies on MyChart before the doctors have had a chance to review them. We understand that in some cases there may be results that are confusing or concerning to you. Please understand that not all results are received at the same time and often the doctors may need to interpret multiple results in order to provide you with the best plan of care or course of treatment. Therefore, we ask that you please give [place] 2 business days to thoroughly review all your results before contacting the office for clarification. Should  we see a critical lab result, you will be contacted sooner.   If You Need Anything After Your Visit  If you have any questions or concerns for your doctor, please call our main line at [phone] and press option 4 to reach your doctor's medical assistant. If no one answers, please leave a voicemail as directed and we will return your call as soon as possible. Messages left after 4 pm will be answered the following business day.   You may also send [place] a message via MyChart. We typically respond to MyChart messages within 1-2 business days.  For prescription refills, please ask your pharmacy to contact our office. Our fax number is [phone].  If you have an urgent issue when the clinic is closed that cannot wait until the next business day, you can page your doctor at the number below.    Please note that while we do our best to be available for urgent issues outside of office hours, we are not available 24/7.   If you have an urgent issue and are unable to reach [place], you may choose to seek medical care at your doctor's office, retail clinic, urgent care center, or emergency room.  If you have a medical emergency, please immediately call 911 or go to the emergency department.  Pager Numbers  - Dr. Gwen Pounds: [phone]  - Dr. Neale Burly: [phone]  - Dr. Roseanne Reno: [phone]  In the event of inclement weather, please call our main line at  [phone] for an update on the status of any delays or closures.  Dermatology Medication Tips: Please keep the boxes that topical medications come in in order to help keep track of the instructions about where and how to use these. Pharmacies typically print the medication instructions only on the boxes and not directly on the medication tubes.   If your medication is too expensive, please contact our office at [phone] option 4 or send [place] a message through MyChart.   We are unable to tell what your co-pay for medications will be in  advance as this is different depending on your insurance coverage. However, we may be able to find a substitute medication at lower cost or fill out paperwork to get insurance to cover a needed medication.   If a prior authorization is required to get your medication covered by your insurance company, please allow [place] 1-2 business days to complete this process.  Drug prices often vary depending on where the prescription is filled and some pharmacies may offer cheaper prices.  The website www.goodrx.com contains coupons for medications through different pharmacies. The prices here do not account for what the cost may be with help from insurance (it may be cheaper with your insurance), but the website can give you the price if you did not use any insurance.  - You can print the associated coupon and take it with your prescription to the pharmacy.  - You may also stop by our office during regular business hours and pick up a GoodRx coupon card.  - If you need your prescription sent electronically to a different pharmacy, notify our office through [facility] or by phone at [phone] option 4.

## 2023-02-09 NOTE — Progress Notes (Signed)
   Follow-Up Visit   Subjective  Kerry George is a 82 y.o. female who presents for the following: place at left arm that has been there for several months. It started as a brown patch and would open up and bleed, not itchy. She covered with neosporin and band-aid which did not help. Once she left it uncovered it started to heal and has cleared up since making this appointment.    The following portions of the chart were reviewed this encounter and updated as appropriate: medications, allergies, medical history  Review of Systems:  No other skin or systemic complaints except as noted in HPI or Assessment and Plan.  Objective  Well appearing patient in no apparent distress; mood and affect are within normal limits.   A focused examination was performed of the following areas: Left arm  Relevant exam findings are noted in the Assessment and Plan.    Assessment & Plan   SEBORRHEIC KERATOSIS - Stuck-on, waxy, tan-brown papule adjacent to pink scaly patch on left forearm. Healing lesion. No evidence of skin cancer  - Benign-appearing - Discussed benign etiology and prognosis. - Observe. Avoid neosporin given risk of allergy - Call for any changes   Return for TBSE, as scheduled.  Anise Salvo, RMA, am acting as scribe for Elie Goody, MD .   Documentation: I have reviewed the above documentation for accuracy and completeness, and I agree with the above.  Elie Goody, MD

## 2023-05-25 ENCOUNTER — Other Ambulatory Visit: Payer: Self-pay | Admitting: Internal Medicine

## 2023-05-25 DIAGNOSIS — R053 Chronic cough: Secondary | ICD-10-CM

## 2023-05-27 ENCOUNTER — Ambulatory Visit
Admission: RE | Admit: 2023-05-27 | Discharge: 2023-05-27 | Disposition: A | Discharge status: Home / Self Care | Payer: Medicare Other | Source: Ambulatory Visit | Attending: Internal Medicine | Admitting: Internal Medicine

## 2023-05-27 DIAGNOSIS — R053 Chronic cough: Secondary | ICD-10-CM

## 2023-06-02 ENCOUNTER — Other Ambulatory Visit: Payer: Self-pay | Admitting: Dermatology

## 2023-06-02 ENCOUNTER — Ambulatory Visit: Payer: Medicare Other | Admitting: Dermatology

## 2023-06-02 DIAGNOSIS — L2089 Other atopic dermatitis: Secondary | ICD-10-CM

## 2023-06-02 DIAGNOSIS — Z7189 Other specified counseling: Secondary | ICD-10-CM

## 2023-06-02 DIAGNOSIS — L209 Atopic dermatitis, unspecified: Secondary | ICD-10-CM | POA: Diagnosis not present

## 2023-06-02 DIAGNOSIS — L309 Dermatitis, unspecified: Secondary | ICD-10-CM | POA: Diagnosis not present

## 2023-06-02 MED ORDER — BETAMETHASONE DIPROPIONATE AUG 0.05 % EX CREA: TOPICAL_CREAM | CUTANEOUS | 1 refills | Status: DC

## 2023-06-02 MED ORDER — MUPIROCIN 2 % EX OINT: TOPICAL_OINTMENT | CUTANEOUS | 1 refills | Status: DC

## 2023-06-02 NOTE — Progress Notes (Signed)
Follow-Up Visit   Subjective  Kerry George is a 82 y.o. female who presents for the following:  She has history of dermatitis with LSC, but rash has been severe since spring on the trunk. She has tried betamethasone dipropionate cream and hydrocortisone 2.5% cream once or twice a day. She uses Aveeno Skin Relief Body Wash, Curel In shower moisturizer, HydroBoost body moisturizer, Eucerin Eczema. Patient has bad allergies and has upcoming appointment with pulmonologist. This past year has been the worst of seasonal allergies with dry, itchy eyes. She takes Singulair, Zyrtec, and Flonase. She has tried Elidel cream in the past as well which didn't help.  The following portions of the chart were reviewed this encounter and updated as appropriate: medications, allergies, medical history  Review of Systems:  No other skin or systemic complaints except as noted in HPI or Assessment and Plan.  Objective  Well appearing patient in no apparent distress; mood and affect are within normal limits.  A full examination was performed including Relevant physical exam findings are noted in the Assessment and Plan.    Assessment & Plan    Atopic Dermatitis- Severe Exam: Pink scaly patches abdomen, buttocks, left shoulder, mid back with scattered excoriations. 10% BSA  Chronic and persistent condition with duration or expected duration over one year. Condition is bothersome/symptomatic for patient. Currently flared.  Atopic dermatitis (eczema) is a chronic, relapsing, pruritic condition that can significantly affect quality of life. It is often associated with allergic rhinitis and/or asthma and can require treatment with topical medications, phototherapy, or in severe cases biologic injectable medication (Dupixent; Adbry) or Oral JAK inhibitors.    Treatment Plan: Discussed Dupixent injections, Rinvoq, Cibinqo.  Labs reviewed 05/12/2023. Pending QuantiFERON-TB Gold and send in Rinvoq Samples given (x  1 month) of Rinvoq 15 MG 1 po every day.  Continue augmented betamethasone dipropionate cream BID x 2 weeks, avoid skin folds. Avoid applying to face, groin, and axilla. Use as directed. Long-term use can cause thinning of the skin. Continue hydrocortisone 2.5% cream once to twice daily to affected areas.  Start mupirocin 2% ointment twice daily to open areas   Discussed potential risks/benefits of JAK inhibitors (Rinvoq/Cibinqo) which are new oral biologic medications used for treating severe, refractory atopic dermatitis.  They are very effective in resolving itchy eczema rashes of the skin.  Overall potential side effects are very minimal and mild and they are much safer than oral steroids. The black box warning for the JAK inhibitor class was discussed, including rare cardiovascular effects, thrombosis, and serious bacterial and viral infections, such as TB or herpes zoster, although the severe CV side effects have not been seen with Rinvoq or Cibinqo in their safety trials.  Shingles vaccination may be recommended prior to starting medication.  Baseline lab tests are done prior to initiation of medication to ensure pt has no underlying lab abnormalities or infection, and then rechecked at 3 months.  If normal, then periodic doctor visits and annual labs are performed for long term medication management.   Other atopic dermatitis  Related Procedures QuantiFERON-TB Gold Plus  Dermatitis  Related Medications pimecrolimus (ELIDEL) 1 % cream Apply to rash on face and body folds twice a day until improved.  augmented betamethasone dipropionate (DIPROLENE-AF) 0.05 % cream APPLY TO THE AFFECTED AREA(S) OF BODY ONE TO TWO TIMES DAILY AS NEEDED WHEN FLARED, avoid face,groin, axilla   Return in about 1 month (around 07/02/2023) for Atopic Dermatitis and TBSE.  Wendee Beavers, CMA, am  acting as scribe for Willeen Niece, MD .   Documentation: I have reviewed the above documentation for accuracy  and completeness, and I agree with the above.  Willeen Niece, MD

## 2023-06-02 NOTE — Patient Instructions (Signed)

## 2023-06-05 LAB — QUANTIFERON-TB GOLD PLUS
QuantiFERON Mitogen Value: 8.8 [IU]/mL
QuantiFERON Nil Value: 0 [IU]/mL
QuantiFERON TB1 Ag Value: 0 [IU]/mL
QuantiFERON TB2 Ag Value: 0 [IU]/mL
QuantiFERON-TB Gold Plus: NEGATIVE

## 2023-06-08 ENCOUNTER — Telehealth: Payer: Self-pay

## 2023-06-08 ENCOUNTER — Other Ambulatory Visit: Payer: Self-pay

## 2023-06-08 MED ORDER — DUPIXENT 300 MG/2ML ~~LOC~~ SOSY: 300.0000 mg | PREFILLED_SYRINGE | SUBCUTANEOUS | 6 refills | Status: DC

## 2023-06-08 MED ORDER — DUPIXENT 300 MG/2ML ~~LOC~~ SOSY: 600.0000 mg | PREFILLED_SYRINGE | Freq: Once | SUBCUTANEOUS | 0 refills | Status: AC

## 2023-06-08 NOTE — Telephone Encounter (Signed)
-----   Message from Willeen Niece sent at 06/08/2023 10:12 AM EST ----- TB negative, continue Rinvoq samples, and keep f/up as scheduled - please call patient

## 2023-06-08 NOTE — Telephone Encounter (Signed)
Lft pt msg to call for lab results/sh

## 2023-06-08 NOTE — Progress Notes (Signed)
Dupixent sent to [facility]

## 2023-06-08 NOTE — Telephone Encounter (Signed)
Advised pt of lab results.  She saw a pulmonologist and he advised her to d/c Rinvoq he was starting her on a Trelegy.  Her pulmonologist thinks Dupixent may be a better choice.  She would like to know what she should do?/sh

## 2023-06-15 ENCOUNTER — Other Ambulatory Visit: Payer: Self-pay

## 2023-06-15 DIAGNOSIS — L209 Atopic dermatitis, unspecified: Secondary | ICD-10-CM

## 2023-06-15 MED ORDER — DUPIXENT 300 MG/2ML ~~LOC~~ SOAJ: 600.0000 mg | Freq: Once | SUBCUTANEOUS | 0 refills | Status: AC

## 2023-06-15 MED ORDER — DUPIXENT 300 MG/2ML ~~LOC~~ SOSY: 300.0000 mg | PREFILLED_SYRINGE | SUBCUTANEOUS | 6 refills | Status: DC

## 2023-06-15 NOTE — Progress Notes (Signed)
Fax received from [facility] to resend prescriptions with ICD10 code attached to prescriptions.

## 2023-07-08 ENCOUNTER — Ambulatory Visit: Payer: Medicare Other | Admitting: Dermatology

## 2023-07-08 ENCOUNTER — Encounter: Payer: Self-pay | Admitting: Dermatology

## 2023-07-08 DIAGNOSIS — D1801 Hemangioma of skin and subcutaneous tissue: Secondary | ICD-10-CM

## 2023-07-08 DIAGNOSIS — L578 Other skin changes due to chronic exposure to nonionizing radiation: Secondary | ICD-10-CM | POA: Diagnosis not present

## 2023-07-08 DIAGNOSIS — L814 Other melanin hyperpigmentation: Secondary | ICD-10-CM

## 2023-07-08 DIAGNOSIS — L2089 Other atopic dermatitis: Secondary | ICD-10-CM | POA: Diagnosis not present

## 2023-07-08 DIAGNOSIS — W908XXA Exposure to other nonionizing radiation, initial encounter: Secondary | ICD-10-CM

## 2023-07-08 DIAGNOSIS — Z85828 Personal history of other malignant neoplasm of skin: Secondary | ICD-10-CM

## 2023-07-08 DIAGNOSIS — L2081 Atopic neurodermatitis: Secondary | ICD-10-CM

## 2023-07-08 DIAGNOSIS — Z1283 Encounter for screening for malignant neoplasm of skin: Secondary | ICD-10-CM

## 2023-07-08 DIAGNOSIS — L821 Other seborrheic keratosis: Secondary | ICD-10-CM

## 2023-07-08 DIAGNOSIS — L209 Atopic dermatitis, unspecified: Secondary | ICD-10-CM

## 2023-07-08 MED ORDER — DUPILUMAB 300 MG/2ML ~~LOC~~ SOAJ
300.0000 mg | Freq: Once | SUBCUTANEOUS | Status: AC
  Administered 2023-07-23: 300 mg via SUBCUTANEOUS

## 2023-07-08 MED ORDER — DUPILUMAB 300 MG/2ML ~~LOC~~ SOAJ
600.0000 mg | Freq: Once | SUBCUTANEOUS | Status: AC
  Administered 2023-07-08: 600 mg via SUBCUTANEOUS

## 2023-07-08 NOTE — Patient Instructions (Signed)

## 2023-07-08 NOTE — Progress Notes (Signed)
Follow-Up Visit   Subjective  Kerry George is a 82 y.o. female who presents for the following: Atopic Dermatitis of the trunk and extremities. She is improving using augmented betamethasone dipropionate cream, hydrocortisone 2.5% cream, mupirocin 2% ointment. But she still has a lot of itchy areas on back.  Her pulmonologist had her stop Rinvoq and preferred her to start Dupixent injections.  The patient presents for Total-Body Skin Exam (TBSE) for skin cancer screening and mole check.  The patient has spots, moles and lesions to be evaluated, some may be new or changing and the patient has concerns that these could be cancer.   The following portions of the chart were reviewed this encounter and updated as appropriate: medications, allergies, medical history  Review of Systems:  No other skin or systemic complaints except as noted in HPI or Assessment and Plan.  Objective  Well appearing patient in no apparent distress; mood and affect are within normal limits.  Areas Examined: Face, trunk, extremitites  Relevant physical exam findings are noted in the Assessment and Plan.    Assessment & Plan  OTHER ATOPIC DERMATITIS   Related Medications Dupilumab SOAJ 600 mg  Dupilumab SOAJ 300 mg  Skin cancer screening performed today.   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  - Recommend staying in the shade or wearing long sleeves, sun glasses (UVA+UVB protection) and wide brim hats (4-inch brim around the entire circumference of the hat). - Call for new or changing lesions.  SEBORRHEIC KERATOSIS - Stuck-on, waxy, tan-brown papules and/or plaques  - Benign-appearing - Discussed benign etiology and prognosis. - Observe - Call for any  changes  LENTIGINES Exam: scattered tan macules Due to sun exposure Treatment Plan: Benign-appearing, observe. Recommend daily broad spectrum sunscreen SPF 30+ to sun-exposed areas, reapply every 2 hours as needed.  Call for any changes  HEMANGIOMA Exam: red papule(s) Discussed benign nature. Recommend observation. Call for changes.  ATOPIC DERMATITIS Exam: Pink scaly patch with excoriations of left abdomen, mid and lower back, right buttock, left shoulder; healing excoriations of the mid abdomen; dusky erythema with xerosis of the lower abdomen; 8% BSA  Chronic and persistent condition with duration or expected duration over one year. Condition is improving with treatment but not currently at goal. Pt still has significant itchy rash.   Atopic dermatitis (eczema) is a chronic, relapsing, pruritic condition that can significantly affect quality of life. It is often associated with allergic rhinitis and/or asthma and can require treatment with topical medications, phototherapy, or in severe cases biologic injectable medication (Dupixent; Adbry) or Oral JAK inhibitors.  Treatment Plan: Continue topical treatment to aas as previously prescribed Discussed Dupixent injections. Patient would like to start. Dupixent MyWay Enrollment Form filled out.  Dupixent loading dose given in office today. Patient tolerated well Dupixent sample 300 mg/72mL x 2 given into left and right thighs today.  Lot [id number]  Exp 12/18/2024 NDC 4696-2952-84  Dupilumab (Dupixent) is a treatment given by injection for adults and children with moderate-to-severe atopic dermatitis. Goal is control of skin condition, not cure. It is given as 2 injections at the first dose followed by 1 injection ever 2 weeks thereafter.  Young children are dosed monthly.  Potential side effects include allergic reaction, herpes infections, injection site reactions and conjunctivitis (inflammation of the eyes).  The use of Dupixent requires  long term medication management, including periodic office visits.  Recommend gentle skin care.   HISTORY OF SKIN CANCER L cheek, Mohs surgery in past. - Clear. Observe for recurrence.  - Call clinic for new or changing lesions.   - Recommend regular skin exams, daily broad-spectrum spf 30+ sunscreen use, and photoprotection.      Return in about 6 weeks (around 08/19/2023), or q2wks for nurse visit (injections).  ICherlyn Labella, CMA, am acting as scribe for Willeen Niece, MD .   Documentation: I have reviewed the above documentation for accuracy and completeness, and I agree with the above.  Willeen Niece, MD

## 2023-07-22 ENCOUNTER — Encounter: Payer: Self-pay | Admitting: Dermatology

## 2023-07-23 ENCOUNTER — Ambulatory Visit: Payer: Medicare Other

## 2023-07-23 ENCOUNTER — Other Ambulatory Visit: Payer: Self-pay

## 2023-07-23 DIAGNOSIS — L2089 Other atopic dermatitis: Secondary | ICD-10-CM

## 2023-07-23 DIAGNOSIS — L209 Atopic dermatitis, unspecified: Secondary | ICD-10-CM

## 2023-07-23 MED ORDER — DUPIXENT 300 MG/2ML ~~LOC~~ SOAJ: 300.0000 mg | SUBCUTANEOUS | 5 refills | Status: DC

## 2023-07-23 NOTE — Progress Notes (Signed)
 Patient here today for two week Dupixent  injection for serve atopic dermatitis.   Dupixent  Pen 300mg  injected into left lower abdomen. Patient tolerated injection well.  LOT: [id number] EXP: 12/18/24  RX sent to [facility] to see how much copay will be with new year and new Part D rules. Patient also given Dupixent  My Way phone number to question patient assistance.   Alan Pizza, RMA

## 2023-07-23 NOTE — Progress Notes (Signed)
 Patient is going to run Dupixent through [employer] with new 2025 benefits to see what copay will be. aw

## 2023-08-05 ENCOUNTER — Ambulatory Visit: Payer: Medicare Other | Admitting: Dermatology

## 2023-08-05 DIAGNOSIS — Z79899 Other long term (current) drug therapy: Secondary | ICD-10-CM

## 2023-08-05 DIAGNOSIS — L209 Atopic dermatitis, unspecified: Secondary | ICD-10-CM | POA: Diagnosis not present

## 2023-08-05 DIAGNOSIS — L2081 Atopic neurodermatitis: Secondary | ICD-10-CM

## 2023-08-05 MED ORDER — DUPILUMAB 300 MG/2ML ~~LOC~~ SOSY
300.0000 mg | PREFILLED_SYRINGE | SUBCUTANEOUS | Status: AC
  Administered 2023-08-05 – 2023-08-20 (×2): 300 mg via SUBCUTANEOUS

## 2023-08-05 NOTE — Progress Notes (Signed)
   Follow-Up Visit   Subjective  Kerry George is a 83 y.o. female who presents for the following: Atopic Derm, trunk, Dupixent  sq q 2wks, not currently using any topicals, dry eyes but had before starting Dupixent , no new areas, itching has improved but not completely gone. The patient has spots, moles and lesions to be evaluated, some may be new or changing and the patient may have concern these could be cancer.   The following portions of the chart were reviewed this encounter and updated as appropriate: medications, allergies, medical history  Review of Systems:  No other skin or systemic complaints except as noted in HPI or Assessment and Plan.  Objective  Well appearing patient in no apparent distress; mood and affect are within normal limits.   A focused examination was performed of the following areas: trunk  Relevant exam findings are noted in the Assessment and Plan.    Assessment & Plan   ATOPIC DERMATITIS Exam: light pink scaly patches back, pink macules abdomen with xerosis, chest clear 4% BSA  Chronic and persistent condition with duration or expected duration over one year. Condition is improving with treatment but not currently at goal.  Pt started ~ one month ago with good results already.   Atopic dermatitis - Severe, on Dupixent  (biologic medication).  Atopic dermatitis (eczema) is a chronic, relapsing, pruritic condition that can significantly affect quality of life. It is often associated with allergic rhinitis and/or asthma and can require treatment with topical medications, phototherapy, or in severe cases biologic medications, which require long term medication management.    Treatment Plan: Cont Dupixent  300mg /38ml sq injections q 2wks, Medicine is approved but too expensive copay. Pt is getting samples until she works out pt assistance with Dupixent  MyWay Dupixent  300mg /92ml sq injection today to R abdomen Lot [id number], exp 03/20/25  Dupilumab  (Dupixent ) is a  treatment given by injection for adults and children with moderate-to-severe atopic dermatitis. Goal is control of skin condition, not cure. It is given as 2 injections at the first dose followed by 1 injection ever 2 weeks thereafter.  Young children are dosed monthly.  Potential side effects include allergic reaction, herpes infections, injection site reactions and conjunctivitis (inflammation of the eyes).  The use of Dupixent  requires long term medication management, including periodic office visits.  Long term medication management.  Patient is using long term (months to years) prescription medication  to control their dermatologic condition.  These medications require periodic monitoring to evaluate for efficacy and side effects and may require periodic laboratory monitoring.    Recommend gentle skin care.   ATOPIC NEURODERMATITIS   Related Medications dupilumab  (DUPIXENT ) prefilled syringe 300 mg   Return for every 2 wks with nurse for Dupixent  injections until 8m f/u with Dr. Annette Barters.  I, Rollie Clipper, RMA, am acting as scribe for Artemio Larry, MD .   Documentation: I have reviewed the above documentation for accuracy and completeness, and I agree with the above.  Artemio Larry, MD

## 2023-08-05 NOTE — Patient Instructions (Signed)

## 2023-08-06 ENCOUNTER — Ambulatory Visit: Payer: Medicare Other

## 2023-08-20 ENCOUNTER — Telehealth: Payer: Self-pay

## 2023-08-20 ENCOUNTER — Ambulatory Visit: Payer: Medicare Other

## 2023-08-20 DIAGNOSIS — L209 Atopic dermatitis, unspecified: Secondary | ICD-10-CM | POA: Diagnosis not present

## 2023-08-20 NOTE — Telephone Encounter (Signed)
Talked in great detail with patient regarding options for Dupixent RX. Patient still needs to contact Dupixent My Way to see if she eligable for PAP. She also needs to reach out to her Part D plan to see about her $2,000 out of pocket/setting up her monthly payments.  She also has been referred to an allergist by PCP and wants to see what medications she may be put on for insurance/cost purposes.   Patient really wants to know if you think this may be long term or short term treatment for her. She is willing to pay for Dupixent with the new Medicare Part D program but does not want to pay the $2,000 upfront and then stop medication.

## 2023-08-20 NOTE — Progress Notes (Signed)
Patient here today for two week Dupixent injection for serve atopic dermatitis.    Dupixent Pen 300mg  injected into left lower abdomen. Patient tolerated injection well.   LOT: [id number] EXP: 02/2025   Talked in great detail with patient regarding options for Dupixent RX. Patient still needs to contact Dupixent My Way to see if she eligable for PAP. She also needs to reach out to her Part D plan to see about her $2,000 out of pocket/setting up her monthly payments.  She also has been referred to an allergist by PCP and wants to see what medications she may be put on for insurance/cost purposes.    Dorathy Daft, RMA

## 2023-08-24 NOTE — Telephone Encounter (Signed)
Left message for patient to return my call. aw 

## 2023-08-26 NOTE — Telephone Encounter (Signed)
 Left second message to return my call and MyChart message sent. aw

## 2023-09-03 ENCOUNTER — Ambulatory Visit: Payer: Medicare Other

## 2023-09-03 NOTE — Telephone Encounter (Signed)
Patient picked up box of Dupixent Pens for at home injections.   LOT: [id number] EXP: 03/20/2025

## 2023-09-10 ENCOUNTER — Encounter: Payer: Self-pay | Admitting: Dermatology

## 2023-10-08 ENCOUNTER — Other Ambulatory Visit: Payer: Self-pay | Admitting: Physician Assistant

## 2023-10-08 DIAGNOSIS — Z96652 Presence of left artificial knee joint: Secondary | ICD-10-CM

## 2023-10-27 ENCOUNTER — Other Ambulatory Visit

## 2023-11-03 ENCOUNTER — Encounter
Admission: RE | Admit: 2023-11-03 | Discharge: 2023-11-03 | Disposition: A | Discharge status: Home / Self Care | Source: Ambulatory Visit | Attending: Physician Assistant | Admitting: Physician Assistant

## 2023-11-03 DIAGNOSIS — Z96652 Presence of left artificial knee joint: Secondary | ICD-10-CM | POA: Diagnosis present

## 2023-11-03 MED ORDER — TECHNETIUM TC 99M MEDRONATE IV KIT
20.0000 | PACK | Freq: Once | INTRAVENOUS | Status: AC | PRN
  Administered 2023-11-03: 20.32 via INTRAVENOUS

## 2023-11-10 ENCOUNTER — Encounter: Payer: Self-pay | Admitting: Dermatology

## 2023-11-25 ENCOUNTER — Other Ambulatory Visit: Payer: Self-pay | Admitting: Orthopedic Surgery

## 2023-11-25 DIAGNOSIS — Z96652 Presence of left artificial knee joint: Secondary | ICD-10-CM

## 2023-11-27 ENCOUNTER — Other Ambulatory Visit: Payer: Self-pay | Admitting: Internal Medicine

## 2023-11-27 DIAGNOSIS — Z1231 Encounter for screening mammogram for malignant neoplasm of breast: Secondary | ICD-10-CM

## 2023-11-30 ENCOUNTER — Ambulatory Visit: Payer: Medicare Other | Admitting: Dermatology

## 2023-12-01 ENCOUNTER — Other Ambulatory Visit: Payer: Self-pay | Admitting: Internal Medicine

## 2023-12-01 DIAGNOSIS — R911 Solitary pulmonary nodule: Secondary | ICD-10-CM

## 2023-12-07 ENCOUNTER — Ambulatory Visit (INDEPENDENT_AMBULATORY_CARE_PROVIDER_SITE_OTHER): Admitting: Dermatology

## 2023-12-07 DIAGNOSIS — D492 Neoplasm of unspecified behavior of bone, soft tissue, and skin: Secondary | ICD-10-CM | POA: Diagnosis not present

## 2023-12-07 DIAGNOSIS — L309 Dermatitis, unspecified: Secondary | ICD-10-CM | POA: Diagnosis not present

## 2023-12-07 DIAGNOSIS — D1801 Hemangioma of skin and subcutaneous tissue: Secondary | ICD-10-CM

## 2023-12-07 DIAGNOSIS — W908XXA Exposure to other nonionizing radiation, initial encounter: Secondary | ICD-10-CM

## 2023-12-07 DIAGNOSIS — L719 Rosacea, unspecified: Secondary | ICD-10-CM

## 2023-12-07 DIAGNOSIS — L821 Other seborrheic keratosis: Secondary | ICD-10-CM

## 2023-12-07 DIAGNOSIS — L814 Other melanin hyperpigmentation: Secondary | ICD-10-CM

## 2023-12-07 DIAGNOSIS — D485 Neoplasm of uncertain behavior of skin: Secondary | ICD-10-CM

## 2023-12-07 DIAGNOSIS — L578 Other skin changes due to chronic exposure to nonionizing radiation: Secondary | ICD-10-CM

## 2023-12-07 DIAGNOSIS — L2089 Other atopic dermatitis: Secondary | ICD-10-CM

## 2023-12-07 DIAGNOSIS — C4491 Basal cell carcinoma of skin, unspecified: Secondary | ICD-10-CM

## 2023-12-07 DIAGNOSIS — L853 Xerosis cutis: Secondary | ICD-10-CM

## 2023-12-07 DIAGNOSIS — Z85828 Personal history of other malignant neoplasm of skin: Secondary | ICD-10-CM

## 2023-12-07 DIAGNOSIS — Z79899 Other long term (current) drug therapy: Secondary | ICD-10-CM

## 2023-12-07 HISTORY — DX: Basal cell carcinoma of skin, unspecified: C44.91

## 2023-12-07 MED ORDER — PIMECROLIMUS 1 % EX CREA: TOPICAL_CREAM | CUTANEOUS | 5 refills | Status: DC

## 2023-12-07 NOTE — Patient Instructions (Addendum)
 Other injectables for eczema - Ebglyss, Adbry, Nemluvio   Wound Care Instructions  Cleanse wound gently with soap and water once a day then pat dry with clean gauze. Apply a thin coat of Petrolatum (petroleum jelly, "Vaseline") over the wound (unless you have an allergy to this). We recommend that you use a new, sterile tube of Vaseline. Do not pick or remove scabs. Do not remove the yellow or white "healing tissue" from the base of the wound.  Cover the wound with fresh, clean, nonstick gauze and secure with paper tape. You may use Band-Aids in place of gauze and tape if the wound is small enough, but would recommend trimming much of the tape off as there is often too much. Sometimes Band-Aids can irritate the skin.  You should call the office for your biopsy report after 1 week if you have not already been contacted.  If you experience any problems, such as abnormal amounts of bleeding, swelling, significant bruising, significant pain, or evidence of infection, please call the office immediately.  FOR ADULT SURGERY PATIENTS: If you need something for pain relief you may take 1 extra strength Tylenol (acetaminophen) AND 2 Ibuprofen (200mg  each) together every 4 hours as needed for pain. (do not take these if you are allergic to them or if you have a reason you should not take them.) Typically, you may only need pain medication for 1 to 3 days.     Gentle Skin Care Guide  1. Bathe no more than once a day.  2. Avoid bathing in hot water  3. Use a mild soap like Dove, Vanicream, Cetaphil, CeraVe. Can use Lever 2000 or Cetaphil antibacterial soap  4. Use soap only where you need it. On most days, use it under your arms, between your legs, and on your feet. Let the water rinse other areas unless visibly dirty.  5. When you get out of the bath/shower, use a towel to gently blot your skin dry, don't rub it.  6. While your skin is still a little damp, apply a moisturizing cream such as  Vanicream, CeraVe, Cetaphil, Eucerin, Sarna lotion or plain Vaseline Jelly. For hands apply Neutrogena [place] Hand Cream or Excipial Hand Cream.  7. Reapply moisturizer any time you start to itch or feel dry.  8. Sometimes using free and clear laundry detergents can be helpful. Fabric softener sheets should be avoided. Downy Free & Gentle liquid, or any liquid fabric softener that is free of dyes and perfumes, it acceptable to use  9. If your doctor has given you prescription creams you may apply moisturizers over them     Due to recent changes in healthcare laws, you may see results of your pathology and/or laboratory studies on MyChart before the doctors have had a chance to review them. We understand that in some cases there may be results that are confusing or concerning to you. Please understand that not all results are received at the same time and often the doctors may need to interpret multiple results in order to provide you with the best plan of care or course of treatment. Therefore, we ask that you please give us  2 business days to thoroughly review all your results before contacting the office for clarification. Should we see a critical lab result, you will be contacted sooner.   If You Need Anything After Your Visit  If you have any questions or concerns for your doctor, please call our main line at [phone] and press option 4  to reach your doctor's medical assistant. If no one answers, please leave a voicemail as directed and we will return your call as soon as possible. Messages left after 4 pm will be answered the following business day.   You may also send us  a message via MyChart. We typically respond to MyChart messages within 1-2 business days.  For prescription refills, please ask your pharmacy to contact our office. Our fax number is [phone].  If you have an urgent issue when the clinic is closed that cannot wait until the next business day, you can page your  doctor at the number below.    Please note that while we do our best to be available for urgent issues outside of office hours, we are not available 24/7.   If you have an urgent issue and are unable to reach us , you may choose to seek medical care at your doctor's office, retail clinic, urgent care center, or emergency room.  If you have a medical emergency, please immediately call 911 or go to the emergency department.  Pager Numbers  - Dr. Bary Likes: [phone]  - Dr. Annette Barters: [phone]  - Dr. Felipe Horton: [phone]   In the event of inclement weather, please call our main line at [phone] for an update on the status of any delays or closures.  Dermatology Medication Tips: Please keep the boxes that topical medications come in in order to help keep track of the instructions about where and how to use these. Pharmacies typically print the medication instructions only on the boxes and not directly on the medication tubes.   If your medication is too expensive, please contact our office at [phone] option 4 or send us  a message through MyChart.   We are unable to tell what your co-pay for medications will be in advance as this is different depending on your insurance coverage. However, we may be able to find a substitute medication at lower cost or fill out paperwork to get insurance to cover a needed medication.   If a prior authorization is required to get your medication covered by your insurance company, please allow us  1-2 business days to complete this process.  Drug prices often vary depending on where the prescription is filled and some pharmacies may offer cheaper prices.  The website www.goodrx.com contains coupons for medications through different pharmacies. The prices here do not account for what the cost may be with help from insurance (it may be cheaper with your insurance), but the website can give you the price if you did not use any insurance.  - You can print  the associated coupon and take it with your prescription to the pharmacy.  - You may also stop by our office during regular business hours and pick up a GoodRx coupon card.  - If you need your prescription sent electronically to a different pharmacy, notify our office through [facility] or by phone at [phone] option 4.     Si Usted Necesita Algo Despus de Su Visita  Tambin puede enviarnos un mensaje a travs de Clinical cytogeneticist. Por lo general respondemos a los mensajes de MyChart en el transcurso de 1 a 2 das hbiles.  Para renovar recetas, por favor pida a su farmacia que se ponga en contacto con nuestra oficina. Franz Jacks de fax es [place] [phone].  Si tiene un asunto urgente cuando la clnica est cerrada y que no puede esperar hasta el siguiente da hbil, puede llamar/localizar a su doctor(a) al nmero que aparece  a continuacin.   Por favor, tenga en cuenta que aunque hacemos todo lo posible para estar disponibles para asuntos urgentes fuera del horario de [place], no estamos disponibles las 24 horas del da, los 7 [address]  [address] de la [place].   Si tiene un problema urgente y no puede comunicarse con nosotros, puede optar por buscar atencin mdica  en el consultorio de su doctor(a), en una clnica privada, en un centro de atencin urgente o en una sala de emergencias.  Si tiene Engineer, drilling, por favor llame inmediatamente al 911 o vaya a la sala de emergencias.  Nmeros de bper  - Dr. Bary Likes: [phone]  - Dra. Annette Barters: [phone]  - Dr. Felipe Horton: [phone]   En caso de inclemencias del tiempo, por favor llame a Lajuan Pila principal al [phone] para una actualizacin sobre el [place] de cualquier retraso o cierre.  Consejos para la medicacin en dermatologa: Por favor, guarde las cajas en las que vienen los medicamentos de uso tpico para ayudarle a seguir las instrucciones sobre dnde y cmo usarlos. Las farmacias generalmente imprimen las  instrucciones del medicamento slo en las cajas y no directamente en los tubos del [place].   Si su medicamento es muy caro, por favor, pngase en contacto con Bettyjane Brunet llamando al [phone] y presione la opcin 4 o envenos un mensaje a travs de Clinical cytogeneticist.   No podemos decirle cul ser su copago por los medicamentos por adelantado ya que esto es diferente dependiendo de la cobertura de su seguro. Sin embargo, es posible que podamos encontrar un medicamento sustituto a Audiological scientist un formulario para que el seguro cubra el medicamento que se considera necesario.   Si se requiere una autorizacin previa para que su compaa de seguros [place] su medicamento, por favor permtanos de 1 a 2 das hbiles para completar este proceso.  Los precios de los medicamentos varan con frecuencia dependiendo del Environmental consultant de dnde se surte la receta y alguna farmacias pueden ofrecer precios ms baratos.  El sitio web www.goodrx.com tiene cupones para medicamentos de Health and safety inspector. Los precios aqu no tienen en cuenta lo que podra costar con la ayuda del seguro (puede ser ms barato con su seguro), pero el sitio web puede darle el precio si no utiliz Tourist information centre manager.  - Puede imprimir el cupn correspondiente y llevarlo con su receta a la farmacia.  - Tambin puede pasar por nuestra oficina durante el horario de atencin regular y Education officer, museum una tarjeta de cupones de GoodRx.  - Si necesita que su receta se enve electrnicamente a una farmacia diferente, informe a nuestra oficina a travs de MyChart de [place] o por telfono llamando al [phone] y presione la opcin 4.

## 2023-12-07 NOTE — Progress Notes (Signed)
 Follow-Up Visit   Subjective  Kerry George is a 83 y.o. female who presents for the following: Atopic Dermatitis of the trunk. She started Dupixent  injections in Dec 2024 and skin cleared, but started having severe join pain where she was put on a walker. She went to [facility] and had bone scan done, which was ok. Last Dupixent  injection was 6 weeks ago and joint pain has improved since she stopped. She has augmented betamethasone  dipropionate cream and 2.5% hydrocortisone  cream. She is flared at the bilateral axilla and inframammary.   The patient presents for Total-Body Skin Exam (TBSE) for skin cancer screening and mole check.  The patient has spots, moles and lesions to be evaluated, some may be new or changing. History of skin cancer on the left cheek treated with Mohs in the past. She has a scaly spot on the right cheek to check. History of AKs.   The following portions of the chart were reviewed this encounter and updated as appropriate: medications, allergies, medical history  Review of Systems:  No other skin or systemic complaints except as noted in HPI or Assessment and Plan.  Objective  Well appearing patient in no apparent distress; mood and affect are within normal limits.  Areas Examined: Trunk, extremities  Relevant physical exam findings are noted in the Assessment and Plan.  Left Temple 5 mm pink flesh papule   Assessment & Plan   NEOPLASM OF UNCERTAIN BEHAVIOR OF SKIN Left Temple Skin / nail biopsy Type of biopsy: tangential   Informed consent: discussed and consent obtained   Patient was prepped and draped in usual sterile fashion: Area prepped with alcohol. Anesthesia: the lesion was anesthetized in a standard fashion   Anesthetic:  1% lidocaine w/ epinephrine 1-100,000 buffered w/ 8.4% NaHCO3 Instrument used: flexible razor blade   Hemostasis achieved with: pressure, aluminum chloride and electrodesiccation   Outcome: patient tolerated procedure well    Post-procedure details: wound care instructions given   Post-procedure details comment:  Ointment and small bandage applied Specimen 1 - Surgical pathology Differential Diagnosis: Inflamed SK r/o BCC Check Margins: No If + for [facility], discussed EDC vs Mohs (Dr Robert Chimes). DERMATITIS   Related Medications augmented betamethasone  dipropionate (DIPROLENE -AF) 0.05 % cream APPLY TO THE AFFECTED AREA(S) OF BODY ONE TO TWO TIMES DAILY AS NEEDED WHEN FLARED, avoid face,groin, axilla pimecrolimus  (ELIDEL ) 1 % cream Apply to affected areas rash twice a day until improved, ok to use on face and body folds.  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  - Recommend staying in the shade or wearing long sleeves, sun glasses (UVA+UVB protection) and wide brim hats (4-inch brim around the entire circumference of the hat). - Call for new or changing lesions.  LENTIGINES Exam: scattered tan macules Due to sun exposure Treatment Plan: Benign-appearing, observe. Recommend daily broad spectrum sunscreen SPF 30+ to sun-exposed areas, reapply every 2 hours as needed.  Call for any changes  SEBORRHEIC KERATOSIS - Stuck-on, waxy, tan-brown papules and/or plaques, including right cheek, left jaw, R post shoulder - Benign-appearing - Discussed benign etiology and prognosis. - Observe - Call for any changes  HEMANGIOMA Exam: red papule(s) Discussed benign nature. Recommend observation. Call for changes.  ATOPIC DERMATITIS Exam: Scaly pink patches at bilateral axilla; mild erythema inframammary; scattered light pink scaly papules on back;  pink scaly papule at left medial shoulder. 6% BSA  Chronic and persistent condition with duration or expected duration over one year. Condition is  symptomatic/ bothersome to patient. Not currently at goal. Skin was improved on Dupixent , but had to D/C Dupixent  due to severe joint pain.  Now skin rash is returning.  Atopic dermatitis (eczema) is a chronic, relapsing, pruritic condition that can significantly affect quality of life. It is often associated with allergic rhinitis and/or asthma and can require treatment with topical medications, phototherapy, or in severe cases biologic injectable medication (Dupixent ; Adbry) or Oral JAK inhibitors.  Treatment Plan: Restart pimecrolimus  cream twice daily until rash improved. Ok to use on face and body folds, dsp 60g 5Rf.  Continue augmented betamethasone  dipropionate cream once to twice daily to body until improved. Avoid applying to face, groin, and axilla. Use as directed. Long-term use can cause thinning of the skin.  Discussed Ebglyss, Nemluvio, and Adbry if rash worsens. Patient defers for now and prefers to continue with topicals.    Recommend gentle skin care.  ROSACEA Exam Mid face erythema with telangiectasias   Chronic and persistent condition with duration or expected duration over one year. Condition is not symptomatic/ bothersome to patient.   Rosacea is a chronic progressive skin condition usually affecting the face of adults, causing redness and/or acne bumps. It is treatable but not curable. It sometimes affects the eyes (ocular rosacea) as well. It may respond to topical and/or systemic medication and can flare with stress, sun exposure, alcohol, exercise, topical steroids (including hydrocortisone /cortisone 10) and some foods.  Daily application of broad spectrum spf 30+ sunscreen to face is recommended to reduce flares.  Treatment Plan Mild, no treatment. Pt deferred tx in past.   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 Mohs scar on left cheek clear  Clear. Observe for recurrence.  Call clinic for new or changing lesions.   Recommend regular skin exams, daily broad-spectrum spf 30+ sunscreen use, and photoprotection.     Return in about 6 months (around 06/08/2024) for Atopic Dermatitis.  IBernardine Bridegroom, CMA, am acting as scribe for Artemio Larry, MD .   Documentation: I have reviewed the above documentation for accuracy and completeness, and I agree with the above.  Artemio Larry, MD

## 2023-12-10 LAB — SURGICAL PATHOLOGY

## 2023-12-16 ENCOUNTER — Ambulatory Visit
Admission: RE | Admit: 2023-12-16 | Discharge: 2023-12-16 | Disposition: A | Discharge status: Home / Self Care | Source: Ambulatory Visit | Attending: Internal Medicine | Admitting: Internal Medicine

## 2023-12-16 DIAGNOSIS — R911 Solitary pulmonary nodule: Secondary | ICD-10-CM

## 2023-12-21 ENCOUNTER — Ambulatory Visit: Payer: Self-pay | Admitting: Dermatology

## 2023-12-21 ENCOUNTER — Encounter: Payer: Self-pay | Admitting: Dermatology

## 2023-12-21 DIAGNOSIS — C44310 Basal cell carcinoma of skin of unspecified parts of face: Secondary | ICD-10-CM

## 2023-12-21 NOTE — Telephone Encounter (Signed)
-----   Message from Artemio Larry sent at 12/21/2023  3:05 PM EDT ----- 1. Skin, left temple :       BASAL CELL CARCINOMA, SUPERFICIAL AND NODULAR PATTERNS    BCC skin cancer- recommend EDC in office or Mohs surgery with Dr. Robert Chimes at [facility]- please call patient  EDC would leave a round depressed whitish scar about the same size as the original lesion.  It is treated here in office in a procedure we call "scrape and burn".  No further pathology would be performed.  Mid to high 80% cure rate.  Mohs would leave a linear scar and pathology would be done at time of procedure to ensure complete removal.  It has a high 90s% cure rate. We would refer patient to a specialist for Mohs surgery.

## 2023-12-21 NOTE — Telephone Encounter (Signed)
 Advised pt of bx results and discussed treatment options EDC vs mohs.  Patient prefers mohs with Dr. Robert Chimes at [facility].  Referral sent to [facility]./sh

## 2023-12-24 ENCOUNTER — Encounter: Payer: Self-pay | Admitting: Dermatology

## 2023-12-29 NOTE — Telephone Encounter (Signed)
 Left pt message in f/u to her message about mohs appointment with Dr. Robert Chimes in September.  Pt had sent a message last week about her appointment being in September and wanted to know if that was ok to wait that long to txt the [facility].  I sent Dr. Martie Slaughter reply via [employer] and had not heard back from patient.  Advised pt to call or send mychart message to let us  know how she would like treated (EDC at our office, mohs with Dr. Paci, for which we would have to send referral, or keep mohs appointment with Dr. Dorrene Gaucher

## 2023-12-30 ENCOUNTER — Encounter: Payer: Self-pay | Admitting: Dermatology

## 2024-01-10 IMAGING — MG MM DIGITAL SCREENING BILAT W/ TOMO AND CAD
8 series · 8 of 24 positions shown · non-contrast
Comparison: Previous exam(s).

CLINICAL DATA: Screening.

EXAM:
DIGITAL SCREENING BILATERAL MAMMOGRAM WITH TOMOSYNTHESIS AND CAD
TECHNIQUE: Bilateral screening digital craniocaudal and mediolateral oblique
mammograms were obtained. Bilateral screening digital breast
tomosynthesis was performed. The images were evaluated with
computer-aided detection.

[R CC synth-2D]
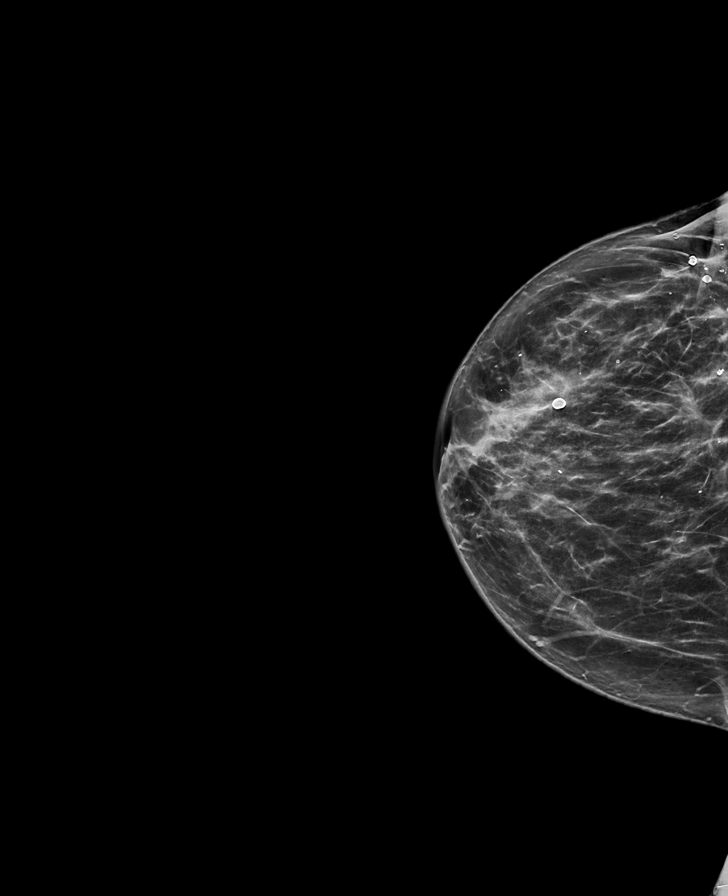

[R MLO synth-2D]
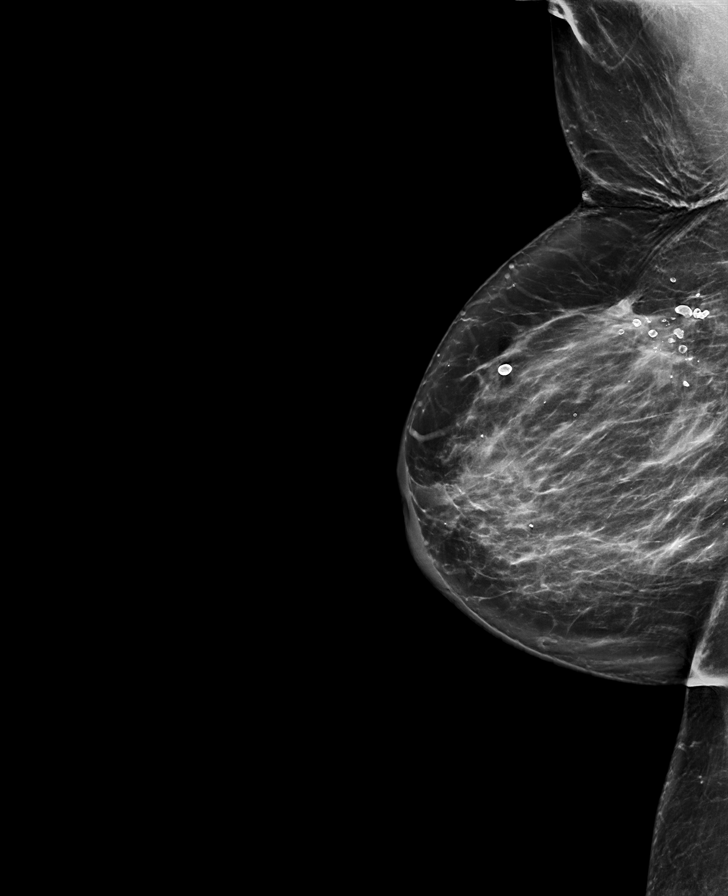

[L MLO synth-2D]
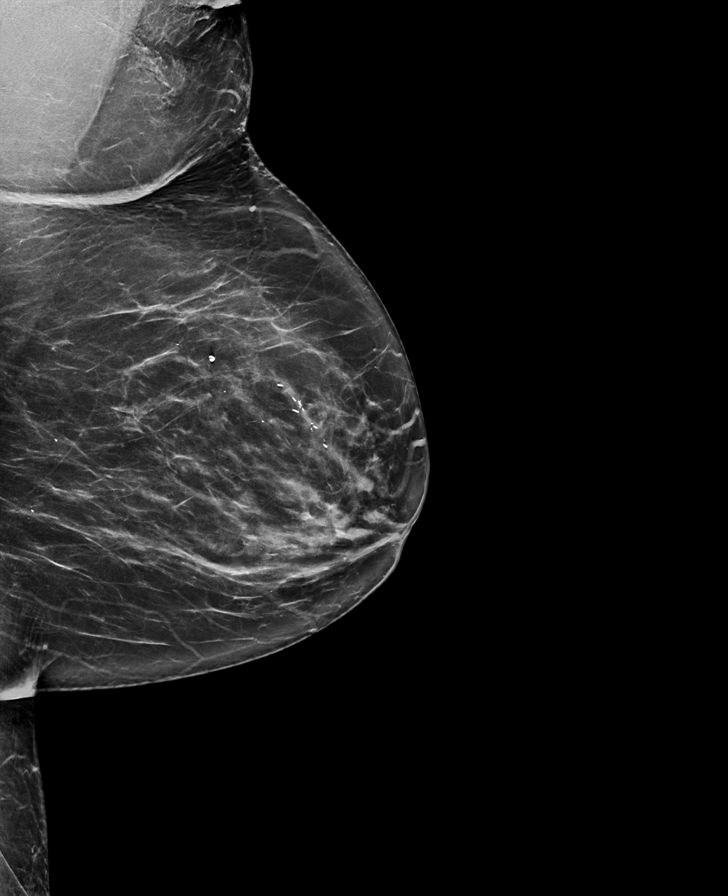

[L CC synth-2D]
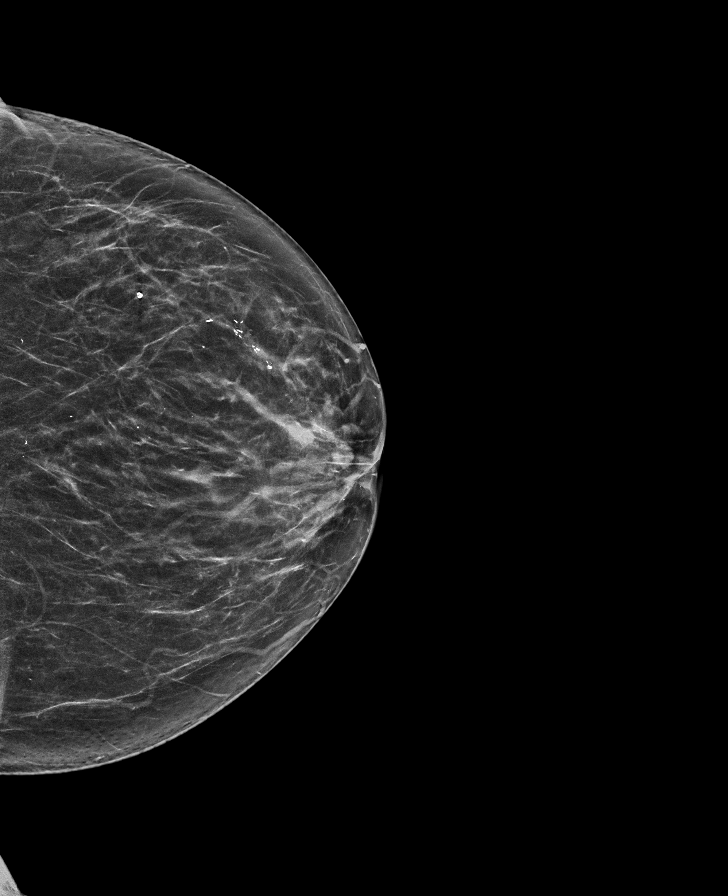

[L MLO tomo · tomo slice 43/85.0]
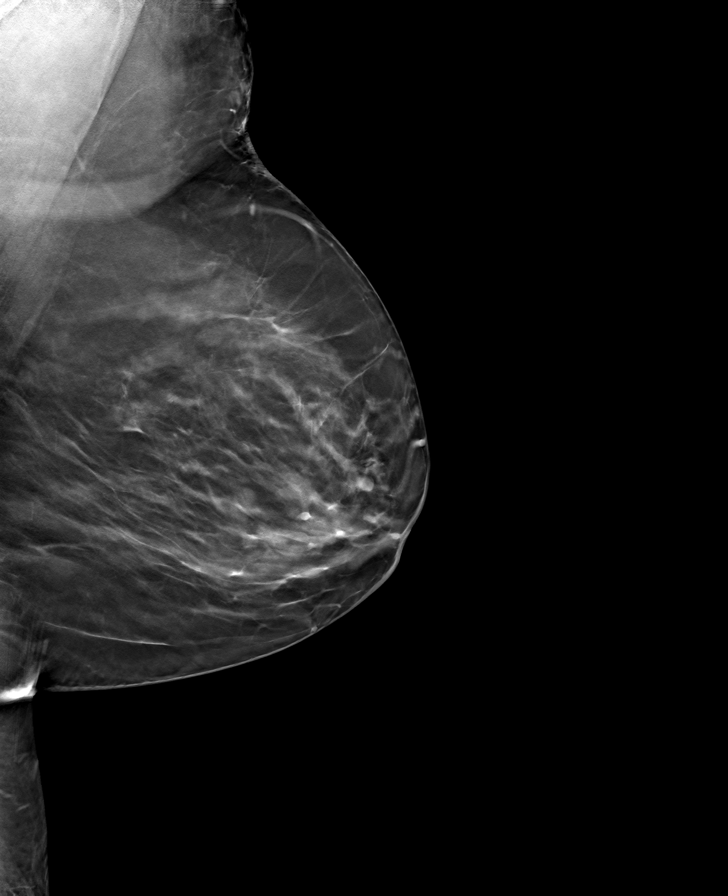

[L CC tomo · tomo slice 35/68.0]
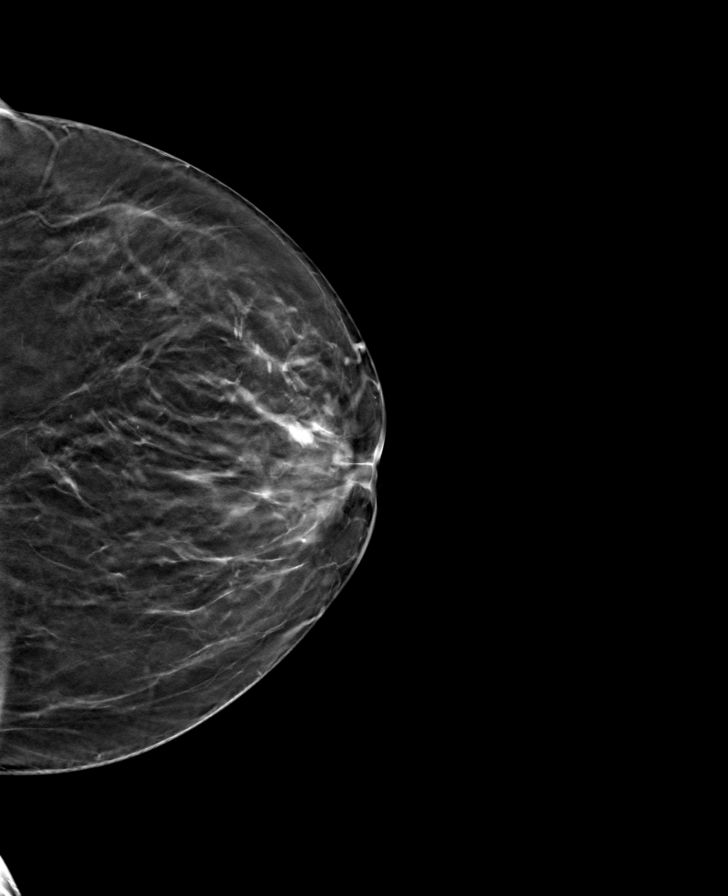

[R CC tomo · tomo slice 37/73.0]
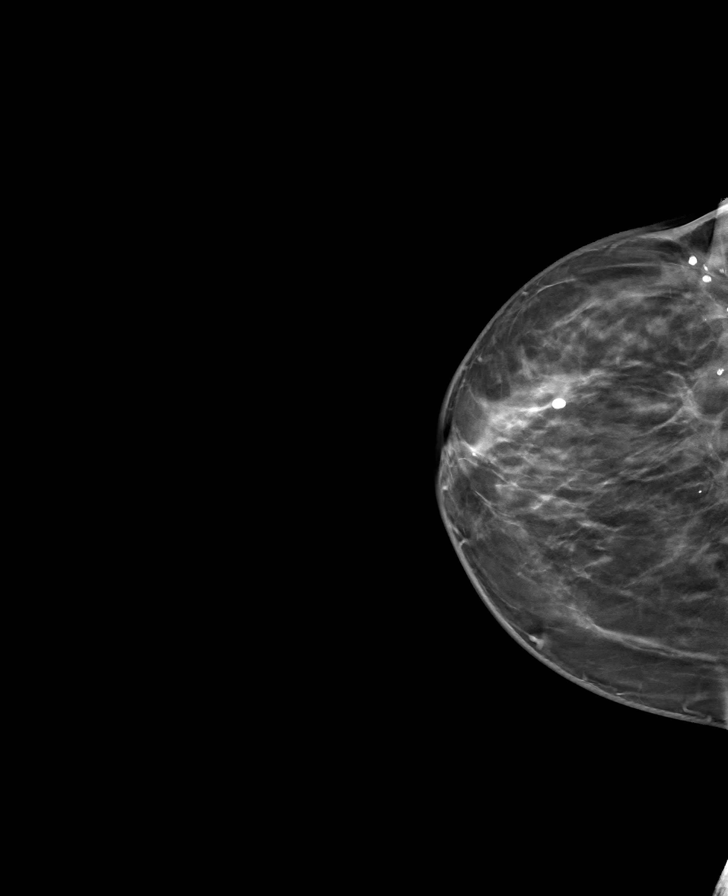

[R MLO tomo · tomo slice 43/85.0]
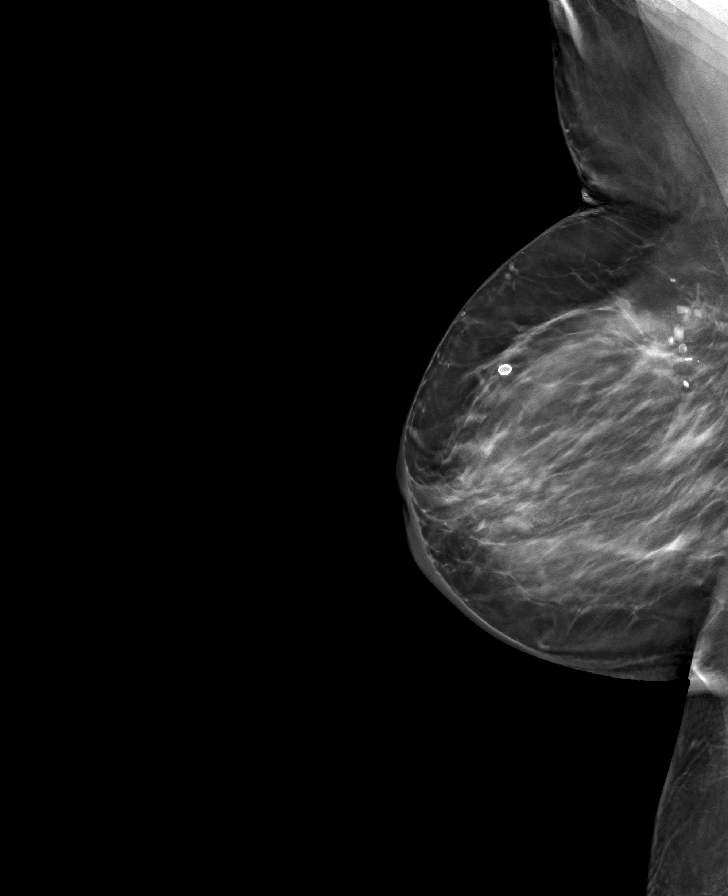

[8 of 24 positions shown; findings below may reference images not displayed]

ACR Breast Density Category b: There are scattered areas of
fibroglandular density.
FINDINGS: There are no findings suspicious for malignancy.
IMPRESSION: No mammographic evidence of malignancy. A result letter of this
screening mammogram will be mailed directly to the patient.

RECOMMENDATION:
Screening mammogram in one year. (Code:51-O-LD2)

BI-RADS CATEGORY  1: Negative.

## 2024-01-12 ENCOUNTER — Ambulatory Visit

## 2024-01-27 ENCOUNTER — Other Ambulatory Visit: Payer: Self-pay | Admitting: Orthopedic Surgery

## 2024-01-27 DIAGNOSIS — Z96652 Presence of left artificial knee joint: Secondary | ICD-10-CM

## 2024-01-27 DIAGNOSIS — I724 Aneurysm of artery of lower extremity: Secondary | ICD-10-CM

## 2024-01-28 ENCOUNTER — Ambulatory Visit
Admission: RE | Admit: 2024-01-28 | Discharge: 2024-01-28 | Disposition: A | Discharge status: Home / Self Care | Source: Ambulatory Visit | Attending: Orthopedic Surgery | Admitting: Orthopedic Surgery

## 2024-01-28 DIAGNOSIS — I724 Aneurysm of artery of lower extremity: Secondary | ICD-10-CM | POA: Diagnosis present

## 2024-01-28 DIAGNOSIS — Z96652 Presence of left artificial knee joint: Secondary | ICD-10-CM | POA: Insufficient documentation

## 2024-02-08 ENCOUNTER — Other Ambulatory Visit (HOSPITAL_COMMUNITY)

## 2024-03-14 ENCOUNTER — Encounter (INDEPENDENT_AMBULATORY_CARE_PROVIDER_SITE_OTHER): Admitting: Nurse Practitioner

## 2024-03-15 ENCOUNTER — Ambulatory Visit
Admission: RE | Admit: 2024-03-15 | Discharge: 2024-03-15 | Disposition: A | Discharge status: Home / Self Care | Source: Ambulatory Visit | Attending: Internal Medicine | Admitting: Internal Medicine

## 2024-03-15 DIAGNOSIS — Z1231 Encounter for screening mammogram for malignant neoplasm of breast: Secondary | ICD-10-CM | POA: Insufficient documentation

## 2024-03-22 ENCOUNTER — Other Ambulatory Visit: Payer: Self-pay | Admitting: Internal Medicine

## 2024-03-22 DIAGNOSIS — R928 Other abnormal and inconclusive findings on diagnostic imaging of breast: Secondary | ICD-10-CM

## 2024-03-24 ENCOUNTER — Other Ambulatory Visit: Payer: Self-pay | Admitting: Internal Medicine

## 2024-03-24 ENCOUNTER — Ambulatory Visit
Admission: RE | Admit: 2024-03-24 | Discharge: 2024-03-24 | Disposition: A | Discharge status: Home / Self Care | Source: Ambulatory Visit | Attending: Internal Medicine | Admitting: Internal Medicine

## 2024-03-24 DIAGNOSIS — R928 Other abnormal and inconclusive findings on diagnostic imaging of breast: Secondary | ICD-10-CM | POA: Diagnosis present

## 2024-03-29 ENCOUNTER — Other Ambulatory Visit
Admission: RE | Admit: 2024-03-29 | Discharge: 2024-03-29 | Disposition: A | Discharge status: Home / Self Care | Source: Ambulatory Visit | Attending: Internal Medicine | Admitting: Internal Medicine

## 2024-03-29 DIAGNOSIS — M7989 Other specified soft tissue disorders: Secondary | ICD-10-CM | POA: Diagnosis present

## 2024-03-29 LAB — D-DIMER, QUANTITATIVE: D-Dimer, Quant: 0.5 ug{FEU}/mL (ref 0.00–0.50)

## 2024-03-30 ENCOUNTER — Inpatient Hospital Stay
Admission: RE | Admit: 2024-03-30 | Discharge: 2024-03-30 | Discharge status: Home / Self Care | Source: Ambulatory Visit | Attending: Internal Medicine | Admitting: Internal Medicine

## 2024-03-30 ENCOUNTER — Other Ambulatory Visit: Payer: Self-pay | Admitting: Internal Medicine

## 2024-03-30 ENCOUNTER — Ambulatory Visit
Admission: RE | Admit: 2024-03-30 | Discharge: 2024-03-30 | Disposition: A | Discharge status: Home / Self Care | Source: Ambulatory Visit | Attending: Internal Medicine | Admitting: Internal Medicine

## 2024-03-30 DIAGNOSIS — M7989 Other specified soft tissue disorders: Secondary | ICD-10-CM

## 2024-03-30 DIAGNOSIS — R928 Other abnormal and inconclusive findings on diagnostic imaging of breast: Secondary | ICD-10-CM

## 2024-03-30 DIAGNOSIS — N6031 Fibrosclerosis of right breast: Secondary | ICD-10-CM | POA: Insufficient documentation

## 2024-03-30 HISTORY — PX: BREAST BIOPSY: SHX20

## 2024-03-30 MED ORDER — LIDOCAINE 1 % OPTIME INJ - NO CHARGE
5.0000 mL | Freq: Once | INTRAMUSCULAR | Status: AC
  Administered 2024-03-30: 5 mL
  Filled 2024-03-30: qty 6

## 2024-03-30 MED ORDER — LIDOCAINE-EPINEPHRINE 1 %-1:100000 IJ SOLN
20.0000 mL | Freq: Once | INTRAMUSCULAR | Status: AC
Start: 2024-03-30 — End: 2024-03-30
  Administered 2024-03-30: 20 mL

## 2024-03-31 LAB — SURGICAL PATHOLOGY

## 2024-04-07 ENCOUNTER — Telehealth: Payer: Self-pay

## 2024-04-07 NOTE — Telephone Encounter (Signed)
 MOHs progress notes in media.

## 2024-04-07 NOTE — Telephone Encounter (Signed)
Specimen tracking and history updated. aw

## 2024-04-19 ENCOUNTER — Ambulatory Visit

## 2024-04-25 ENCOUNTER — Ambulatory Visit
Admission: RE | Admit: 2024-04-25 | Discharge: 2024-04-25 | Disposition: A | Discharge status: Home / Self Care | Source: Ambulatory Visit | Attending: Internal Medicine | Admitting: Internal Medicine

## 2024-04-25 DIAGNOSIS — M7989 Other specified soft tissue disorders: Secondary | ICD-10-CM | POA: Insufficient documentation

## 2024-06-06 ENCOUNTER — Ambulatory Visit: Admitting: Dermatology

## 2024-06-06 ENCOUNTER — Encounter: Payer: Self-pay | Admitting: Dermatology

## 2024-06-06 DIAGNOSIS — Z85828 Personal history of other malignant neoplasm of skin: Secondary | ICD-10-CM | POA: Diagnosis not present

## 2024-06-06 DIAGNOSIS — W908XXA Exposure to other nonionizing radiation, initial encounter: Secondary | ICD-10-CM

## 2024-06-06 DIAGNOSIS — L209 Atopic dermatitis, unspecified: Secondary | ICD-10-CM | POA: Diagnosis not present

## 2024-06-06 DIAGNOSIS — L578 Other skin changes due to chronic exposure to nonionizing radiation: Secondary | ICD-10-CM

## 2024-06-06 DIAGNOSIS — L309 Dermatitis, unspecified: Secondary | ICD-10-CM

## 2024-06-06 DIAGNOSIS — Z79899 Other long term (current) drug therapy: Secondary | ICD-10-CM

## 2024-06-06 MED ORDER — PIMECROLIMUS 1 % EX CREA: TOPICAL_CREAM | CUTANEOUS | 5 refills | Status: AC

## 2024-06-06 MED ORDER — BETAMETHASONE DIPROPIONATE AUG 0.05 % EX CREA: TOPICAL_CREAM | CUTANEOUS | 2 refills | Status: AC

## 2024-06-06 NOTE — Progress Notes (Signed)
 Follow-Up Visit   Subjective  Kerry George is a 83 y.o. female who presents for the following: Eczema, inframammary, lower abdomen, back, using Betamethasone , pimecrolimus    The following portions of the chart were reviewed this encounter and updated as appropriate: medications, allergies, medical history  Review of Systems:  No other skin or systemic complaints except as noted in HPI or Assessment and Plan.  Objective  Well appearing patient in no apparent distress; mood and affect are within normal limits.   A focused examination was performed of the following areas: Inframammary, lower abdomen, back  Relevant exam findings are noted in the Assessment and Plan.    Assessment & Plan   ATOPIC DERMATITIS Back, inframammary, lower abdomen Exam: pink scaly patch lower abdomen, R mid back, R abdomen with excoriations 5% BSA  Chronic and persistent condition with duration or expected duration over one year. Condition is symptomatic/ bothersome to patient. Not currently at goal.   Atopic dermatitis (eczema) is a chronic, relapsing, pruritic condition that can significantly affect quality of life. It is often associated with allergic rhinitis and/or asthma and can require treatment with topical medications, phototherapy, or in severe cases biologic injectable medication (Dupixent ; Adbry) or Oral JAK inhibitors.  Treatment Plan: Cont Elidel  cr bid aa eczema Cont Betamethasone  cr qd/bid for up to 2 weeks if using bid and up to 4 weeks if using qd, prn flares, Caution skin atrophy with long-term use.  Pt declines injectables at this time, Dupixent  gave her joint pain, pt may consider different Biologic (Ebgylss or Adbry) at f/up if not better.  Topical steroids (such as triamcinolone, fluocinolone, fluocinonide, mometasone , clobetasol, halobetasol, betamethasone , hydrocortisone ) can cause thinning and lightening of the skin if they are used for too long in the same area. Your physician  has selected the right strength medicine for your problem and area affected on the body. Please use your medication only as directed by your physician to prevent side effects.   Long term medication management.  Patient is using long term (months to years) prescription medication  to control their dermatologic condition.  These medications require periodic monitoring to evaluate for efficacy and side effects and may require periodic laboratory monitoring.   Recommend gentle skin care.  HISTORY OF BASAL CELL CARCINOMA OF THE SKIN - No evidence of recurrence today - L temple [facility] 9/25 - Recommend regular full body skin exams - Recommend daily broad spectrum sunscreen SPF 30+ to sun-exposed areas, reapply every 2 hours as needed.  - Call if any new or changing lesions are noted between office visits   ACTINIC DAMAGE - chronic, secondary to cumulative UV radiation exposure/sun exposure over time - diffuse scaly erythematous macules with underlying dyspigmentation - Recommend daily broad spectrum sunscreen SPF 30+ to sun-exposed areas, reapply every 2 hours as needed.  - Recommend staying in the shade or wearing long sleeves, sun glasses (UVA+UVB protection) and wide brim hats (4-inch brim around the entire circumference of the hat). - Call for new or changing lesions.   DERMATITIS   Related Medications augmented betamethasone  dipropionate (DIPROLENE -AF) 0.05 % cream qd to bid to aa eczema on body prn flares for up to 2 weeks if using bid and up to 4 weeks if using qd, avoid face, groin, axilla pimecrolimus  (ELIDEL ) 1 % cream Apply to affected areas eczema twice a day until improved, ok to use on face and body folds.  Return in about 6 months (around 12/04/2024) for TBSE, Hx of BCC, Hx of AKs.  I, Grayce Saunas, RMA, am acting as scribe for Rexene Rattler, MD .   Documentation: I have reviewed the above documentation for accuracy and completeness, and I agree with the above.  Rexene Rattler,  MD

## 2024-06-06 NOTE — Patient Instructions (Addendum)
 Eczema Continue Pimecrolimus  cream bid aa eczema Continue Betamethasone  cream one to two times a day for up to 2 weeks if using twice a day and up to 4 weeks if using once daily, prn flares   Due to recent changes in healthcare laws, you may see results of your pathology and/or laboratory studies on MyChart before the doctors have had a chance to review them. We understand that in some cases there may be results that are confusing or concerning to you. Please understand that not all results are received at the same time and often the doctors may need to interpret multiple results in order to provide you with the best plan of care or course of treatment. Therefore, we ask that you please give us  2 business days to thoroughly review all your results before contacting the office for clarification. Should we see a critical lab result, you will be contacted sooner.   If You Need Anything After Your Visit  If you have any questions or concerns for your doctor, please call our main line at [phone] and press option 4 to reach your doctor's medical assistant. If no one answers, please leave a voicemail as directed and we will return your call as soon as possible. Messages left after 4 pm will be answered the following business day.   You may also send us  a message via MyChart. We typically respond to MyChart messages within 1-2 business days.  For prescription refills, please ask your pharmacy to contact our office. Our fax number is [phone].  If you have an urgent issue when the clinic is closed that cannot wait until the next business day, you can page your doctor at the number below.    Please note that while we do our best to be available for urgent issues outside of office hours, we are not available 24/7.   If you have an urgent issue and are unable to reach us , you may choose to seek medical care at your doctor's office, retail clinic, urgent care center, or emergency room.  If you have  a medical emergency, please immediately call 911 or go to the emergency department.  Pager Numbers  - Dr. Hester: [phone]  - Dr. Jackquline: [phone]  - Dr. Claudene: [phone]   - Dr. Raymund: [phone]  In the event of inclement weather, please call our main line at [phone] for an update on the status of any delays or closures.  Dermatology Medication Tips: Please keep the boxes that topical medications come in in order to help keep track of the instructions about where and how to use these. Pharmacies typically print the medication instructions only on the boxes and not directly on the medication tubes.   If your medication is too expensive, please contact our office at [phone] option 4 or send us  a message through MyChart.   We are unable to tell what your co-pay for medications will be in advance as this is different depending on your insurance coverage. However, we may be able to find a substitute medication at lower cost or fill out paperwork to get insurance to cover a needed medication.   If a prior authorization is required to get your medication covered by your insurance company, please allow us  1-2 business days to complete this process.  Drug prices often vary depending on where the prescription is filled and some pharmacies may offer cheaper prices.  The website www.goodrx.com contains coupons for medications through different pharmacies. The prices here do not  account for what the cost may be with help from insurance (it may be cheaper with your insurance), but the website can give you the price if you did not use any insurance.  - You can print the associated coupon and take it with your prescription to the pharmacy.  - You may also stop by our office during regular business hours and pick up a GoodRx coupon card.  - If you need your prescription sent electronically to a different pharmacy, notify our office through [facility] or by phone at  [phone] option 4.     Si Usted Necesita Algo Despus de Su Visita  Tambin puede enviarnos un mensaje a travs de Clinical Cytogeneticist. Por lo general respondemos a los mensajes de MyChart en el transcurso de 1 a 2 das hbiles.  Para renovar recetas, por favor pida a su farmacia que se ponga en contacto con nuestra oficina. Randi lakes de fax es [place] [phone].  Si tiene un asunto urgente cuando la clnica est cerrada y que no puede esperar hasta el siguiente da hbil, puede llamar/localizar a su doctor(a) al nmero que aparece a continuacin.   Por favor, tenga en cuenta que aunque hacemos todo lo posible para estar disponibles para asuntos urgentes fuera del horario de [place], no estamos disponibles las 24 horas del da, los 7 [address]  [address] de la [place].   Si tiene un problema urgente y no puede comunicarse con nosotros, puede optar por buscar atencin mdica  en el consultorio de su doctor(a), en una clnica privada, en un centro de atencin urgente o en una sala de emergencias.  Si tiene engineer, drilling, por favor llame inmediatamente al 911 o vaya a la sala de emergencias.  Nmeros de bper  - Dr. Hester: [phone]  - Dra. Jackquline: [phone]  - Dr. Claudene: [phone]  - Dra. Kitts: [phone]  En caso de inclemencias del [place], por favor llame a nuestra lnea principal al [phone] para una actualizacin sobre el estado de cualquier retraso o cierre.  Consejos para la medicacin en dermatologa: Por favor, guarde las cajas en las que vienen los medicamentos de uso tpico para ayudarle a seguir las instrucciones sobre dnde y cmo usarlos. Las farmacias generalmente imprimen las instrucciones del medicamento slo en las cajas y no directamente en los tubos del [place].   Si su medicamento es muy caro, por favor, pngase en contacto con landry rieger llamando al [phone] y presione la opcin 4 o envenos un mensaje a travs de Clinical Cytogeneticist.   No podemos decirle  cul ser su copago por los medicamentos por adelantado ya que esto es diferente dependiendo de la cobertura de su seguro. Sin embargo, es posible que podamos encontrar un medicamento sustituto a audiological scientist un formulario para que el seguro cubra el medicamento que se considera necesario.   Si se requiere una autorizacin previa para que su compaa de seguros cubra su medicamento, por favor permtanos de 1 a 2 das hbiles para completar este proceso.  Los precios de los medicamentos varan con frecuencia dependiendo del environmental consultant de dnde se surte la receta y alguna farmacias pueden ofrecer precios ms baratos.  El sitio web www.goodrx.com tiene cupones para medicamentos de health and safety inspector. Los precios aqu no tienen en cuenta lo que podra costar con la ayuda del seguro (puede ser ms barato con su seguro), pero el sitio web puede darle el precio si no utiliz tourist information centre manager.  - Puede imprimir el cupn correspondiente y llevarlo con su receta  a la farmacia.  - Tambin puede pasar por nuestra oficina durante el horario de atencin regular y education officer, museum una tarjeta de cupones de GoodRx.  - Si necesita que su receta se enve electrnicamente a una farmacia diferente, informe a nuestra oficina a travs de MyChart de [place] o por telfono llamando al [phone] y presione la opcin 4.

## 2024-06-28 ENCOUNTER — Ambulatory Visit: Admitting: Dermatology

## 2024-12-13 ENCOUNTER — Ambulatory Visit: Admitting: Dermatology
